# Patient Record
Sex: Male | Born: 1935 | Race: Black or African American | Hispanic: No | Marital: Married
Health system: Southern US, Community
[De-identification: ages and names within clinical notes are randomized; demographics above are authoritative.]

---

## 2007-02-15 ENCOUNTER — Ambulatory Visit: Payer: Self-pay | Admitting: Specialist

## 2011-09-20 ENCOUNTER — Inpatient Hospital Stay: Payer: Self-pay | Admitting: Family Medicine

## 2012-07-06 LAB — BASIC METABOLIC PANEL
BUN: 47 mg/dL — ABNORMAL HIGH (ref 7–18)
Calcium, Total: 8.8 mg/dL (ref 8.5–10.1)
Chloride: 105 mmol/L (ref 98–107)
Co2: 26 mmol/L (ref 21–32)
Creatinine: 2.07 mg/dL — ABNORMAL HIGH (ref 0.60–1.30)
EGFR (Non-African Amer.): 28 — ABNORMAL LOW
Potassium: 5.5 mmol/L — ABNORMAL HIGH (ref 3.5–5.1)
Sodium: 140 mmol/L (ref 136–145)

## 2012-07-06 LAB — CBC
HCT: 37.2 % — ABNORMAL LOW (ref 40.0–52.0)
HGB: 12.3 g/dL — ABNORMAL LOW (ref 13.0–18.0)
MCV: 82 fL (ref 80–100)
RBC: 4.52 10*6/uL (ref 4.40–5.90)
RDW: 15.5 % — ABNORMAL HIGH (ref 11.5–14.5)
WBC: 9 10*3/uL (ref 3.8–10.6)

## 2012-07-06 LAB — HEPATIC FUNCTION PANEL A (ARMC)
Albumin: 2.9 g/dL — ABNORMAL LOW (ref 3.4–5.0)
Bilirubin, Direct: 3 mg/dL — ABNORMAL HIGH (ref 0.00–0.20)
SGOT(AST): 24 U/L (ref 15–37)
SGPT (ALT): 15 U/L (ref 12–78)
Total Protein: 7.3 g/dL (ref 6.4–8.2)

## 2012-07-06 LAB — PRO B NATRIURETIC PEPTIDE: B-Type Natriuretic Peptide: 15188 pg/mL — ABNORMAL HIGH (ref 0–450)

## 2012-07-06 LAB — PROTIME-INR
INR: 1.4
Prothrombin Time: 17.1 secs — ABNORMAL HIGH (ref 11.5–14.7)

## 2012-07-06 LAB — CK TOTAL AND CKMB (NOT AT ARMC): CK-MB: 1.3 ng/mL (ref 0.5–3.6)

## 2012-07-06 LAB — TROPONIN I: Troponin-I: 0.07 ng/mL — ABNORMAL HIGH

## 2012-07-06 LAB — APTT: Activated PTT: 35.4 secs (ref 23.6–35.9)

## 2012-07-07 ENCOUNTER — Inpatient Hospital Stay: Payer: Self-pay | Admitting: Family Medicine

## 2012-07-07 LAB — BASIC METABOLIC PANEL
BUN: 47 mg/dL — ABNORMAL HIGH (ref 7–18)
Calcium, Total: 8.7 mg/dL (ref 8.5–10.1)
Co2: 25 mmol/L (ref 21–32)
Creatinine: 1.87 mg/dL — ABNORMAL HIGH (ref 0.60–1.30)
EGFR (African American): 37 — ABNORMAL LOW
EGFR (Non-African Amer.): 32 — ABNORMAL LOW
Glucose: 113 mg/dL — ABNORMAL HIGH (ref 65–99)
Potassium: 5.1 mmol/L (ref 3.5–5.1)
Sodium: 143 mmol/L (ref 136–145)

## 2012-07-07 LAB — TROPONIN I: Troponin-I: 0.07 ng/mL — ABNORMAL HIGH

## 2012-07-08 ENCOUNTER — Ambulatory Visit: Payer: Self-pay | Admitting: Internal Medicine

## 2012-07-08 LAB — CBC WITH DIFFERENTIAL/PLATELET
Basophil #: 0 10*3/uL (ref 0.0–0.1)
Eosinophil #: 0 10*3/uL (ref 0.0–0.7)
Eosinophil %: 0.1 %
HCT: 32.2 % — ABNORMAL LOW (ref 40.0–52.0)
Lymphocyte #: 0.5 10*3/uL — ABNORMAL LOW (ref 1.0–3.6)
MCHC: 33.2 g/dL (ref 32.0–36.0)
MCV: 81 fL (ref 80–100)
Monocyte #: 0.2 x10 3/mm (ref 0.2–1.0)
Neutrophil #: 4 10*3/uL (ref 1.4–6.5)
Platelet: 110 10*3/uL — ABNORMAL LOW (ref 150–440)
RBC: 3.96 10*6/uL — ABNORMAL LOW (ref 4.40–5.90)
RDW: 15.8 % — ABNORMAL HIGH (ref 11.5–14.5)
WBC: 4.7 10*3/uL (ref 3.8–10.6)

## 2012-07-08 LAB — COMPREHENSIVE METABOLIC PANEL
Anion Gap: 9 (ref 7–16)
BUN: 46 mg/dL — ABNORMAL HIGH (ref 7–18)
Bilirubin,Total: 3.7 mg/dL — ABNORMAL HIGH (ref 0.2–1.0)
Calcium, Total: 8.2 mg/dL — ABNORMAL LOW (ref 8.5–10.1)
Co2: 28 mmol/L (ref 21–32)
EGFR (African American): 39 — ABNORMAL LOW
Osmolality: 298 (ref 275–301)
Potassium: 4.1 mmol/L (ref 3.5–5.1)
SGPT (ALT): 12 U/L (ref 12–78)
Sodium: 143 mmol/L (ref 136–145)
Total Protein: 6.1 g/dL — ABNORMAL LOW (ref 6.4–8.2)

## 2012-07-09 LAB — BASIC METABOLIC PANEL
Anion Gap: 10 (ref 7–16)
BUN: 43 mg/dL — ABNORMAL HIGH (ref 7–18)
Calcium, Total: 8.2 mg/dL — ABNORMAL LOW (ref 8.5–10.1)
Chloride: 101 mmol/L (ref 98–107)
Co2: 29 mmol/L (ref 21–32)
Creatinine: 1.55 mg/dL — ABNORMAL HIGH (ref 0.60–1.30)
EGFR (African American): 46 — ABNORMAL LOW
EGFR (Non-African Amer.): 40 — ABNORMAL LOW
Osmolality: 289 (ref 275–301)
Sodium: 140 mmol/L (ref 136–145)

## 2012-07-10 LAB — BASIC METABOLIC PANEL
Chloride: 102 mmol/L (ref 98–107)
EGFR (Non-African Amer.): 37 — ABNORMAL LOW
Glucose: 83 mg/dL (ref 65–99)
Potassium: 4.1 mmol/L (ref 3.5–5.1)

## 2012-07-11 LAB — BASIC METABOLIC PANEL
Anion Gap: 10 (ref 7–16)
Calcium, Total: 8.1 mg/dL — ABNORMAL LOW (ref 8.5–10.1)
Chloride: 103 mmol/L (ref 98–107)
Co2: 30 mmol/L (ref 21–32)
Creatinine: 1.73 mg/dL — ABNORMAL HIGH (ref 0.60–1.30)
EGFR (African American): 40 — ABNORMAL LOW
Glucose: 95 mg/dL (ref 65–99)
Osmolality: 296 (ref 275–301)

## 2012-07-11 LAB — CBC WITH DIFFERENTIAL/PLATELET
Basophil %: 0.5 %
Eosinophil %: 1.3 %
HCT: 34.3 % — ABNORMAL LOW (ref 40.0–52.0)
HGB: 11.3 g/dL — ABNORMAL LOW (ref 13.0–18.0)
Lymphocyte #: 0.7 10*3/uL — ABNORMAL LOW (ref 1.0–3.6)
Lymphocyte %: 20.1 %
MCV: 81 fL (ref 80–100)
Monocyte #: 0.4 x10 3/mm (ref 0.2–1.0)
Monocyte %: 11.3 %
Neutrophil #: 2.3 10*3/uL (ref 1.4–6.5)
Platelet: 115 10*3/uL — ABNORMAL LOW (ref 150–440)
WBC: 3.4 10*3/uL — ABNORMAL LOW (ref 3.8–10.6)

## 2012-07-12 LAB — BASIC METABOLIC PANEL
Anion Gap: 12 (ref 7–16)
BUN: 43 mg/dL — ABNORMAL HIGH (ref 7–18)
Calcium, Total: 8.3 mg/dL — ABNORMAL LOW (ref 8.5–10.1)
Chloride: 101 mmol/L (ref 98–107)
Co2: 28 mmol/L (ref 21–32)
EGFR (African American): 46 — ABNORMAL LOW
Osmolality: 292 (ref 275–301)
Potassium: 3.6 mmol/L (ref 3.5–5.1)

## 2012-08-20 ENCOUNTER — Inpatient Hospital Stay: Payer: Self-pay | Admitting: Family Medicine

## 2012-08-20 LAB — COMPREHENSIVE METABOLIC PANEL
Albumin: 2.8 g/dL — ABNORMAL LOW (ref 3.4–5.0)
Alkaline Phosphatase: 94 U/L (ref 50–136)
BUN: 60 mg/dL — ABNORMAL HIGH (ref 7–18)
Bilirubin,Total: 4.3 mg/dL — ABNORMAL HIGH (ref 0.2–1.0)
Calcium, Total: 8.5 mg/dL (ref 8.5–10.1)
Co2: 27 mmol/L (ref 21–32)
Creatinine: 2.31 mg/dL — ABNORMAL HIGH (ref 0.60–1.30)
Glucose: 75 mg/dL (ref 65–99)
Osmolality: 297 (ref 275–301)
SGPT (ALT): 13 U/L (ref 12–78)
Sodium: 141 mmol/L (ref 136–145)
Total Protein: 7.3 g/dL (ref 6.4–8.2)

## 2012-08-20 LAB — URINALYSIS, COMPLETE
Glucose,UR: NEGATIVE mg/dL (ref 0–75)
Hyaline Cast: 4
Ph: 6 (ref 4.5–8.0)
Protein: 100
RBC,UR: 6 /HPF (ref 0–5)
Squamous Epithelial: <1
WBC UR: 46 /HPF (ref 0–5)

## 2012-08-20 LAB — CBC
HGB: 11.4 g/dL — ABNORMAL LOW (ref 13.0–18.0)
MCHC: 32.3 g/dL (ref 32.0–36.0)
RBC: 4.39 10*6/uL — ABNORMAL LOW (ref 4.40–5.90)
RDW: 16.4 % — ABNORMAL HIGH (ref 11.5–14.5)

## 2012-08-20 LAB — TROPONIN I: Troponin-I: 0.08 ng/mL — ABNORMAL HIGH

## 2012-08-20 LAB — CK TOTAL AND CKMB (NOT AT ARMC)
CK, Total: 211 U/L (ref 35–232)
CK-MB: 2.2 ng/mL (ref 0.5–3.6)

## 2012-08-20 LAB — PROTIME-INR: Prothrombin Time: 19.6 secs — ABNORMAL HIGH (ref 11.5–14.7)

## 2012-08-21 LAB — CBC WITH DIFFERENTIAL/PLATELET
Basophil %: 0.3 %
Eosinophil %: 0 %
HGB: 11.1 g/dL — ABNORMAL LOW (ref 13.0–18.0)
Lymphocyte #: 0.4 10*3/uL — ABNORMAL LOW (ref 1.0–3.6)
MCH: 26.3 pg (ref 26.0–34.0)
Monocyte #: 0.4 x10 3/mm (ref 0.2–1.0)
Monocyte %: 5.1 %
Neutrophil #: 7.4 10*3/uL — ABNORMAL HIGH (ref 1.4–6.5)
Platelet: 106 10*3/uL — ABNORMAL LOW (ref 150–440)
RBC: 4.21 10*6/uL — ABNORMAL LOW (ref 4.40–5.90)
WBC: 8.2 10*3/uL (ref 3.8–10.6)

## 2012-08-21 LAB — COMPREHENSIVE METABOLIC PANEL
Anion Gap: 12 (ref 7–16)
Bilirubin,Total: 3.6 mg/dL — ABNORMAL HIGH (ref 0.2–1.0)
Calcium, Total: 8.3 mg/dL — ABNORMAL LOW (ref 8.5–10.1)
Chloride: 105 mmol/L (ref 98–107)
Creatinine: 2.15 mg/dL — ABNORMAL HIGH (ref 0.60–1.30)
EGFR (African American): 31 — ABNORMAL LOW
Osmolality: 307 (ref 275–301)
Potassium: 4.4 mmol/L (ref 3.5–5.1)
SGOT(AST): 40 U/L — ABNORMAL HIGH (ref 15–37)
SGPT (ALT): 13 U/L (ref 12–78)
Sodium: 145 mmol/L (ref 136–145)
Total Protein: 6.7 g/dL (ref 6.4–8.2)

## 2012-08-21 LAB — TROPONIN I: Troponin-I: 0.1 ng/mL — ABNORMAL HIGH

## 2012-08-22 LAB — BASIC METABOLIC PANEL
Anion Gap: 11 (ref 7–16)
Calcium, Total: 8.5 mg/dL (ref 8.5–10.1)
Chloride: 104 mmol/L (ref 98–107)
Co2: 27 mmol/L (ref 21–32)
EGFR (African American): 34 — ABNORMAL LOW
EGFR (Non-African Amer.): 30 — ABNORMAL LOW
Glucose: 98 mg/dL (ref 65–99)
Osmolality: 302 (ref 275–301)
Potassium: 4.2 mmol/L (ref 3.5–5.1)
Sodium: 142 mmol/L (ref 136–145)

## 2012-08-22 LAB — URINE CULTURE

## 2012-08-23 LAB — BASIC METABOLIC PANEL
Anion Gap: 11 (ref 7–16)
Calcium, Total: 8.4 mg/dL — ABNORMAL LOW (ref 8.5–10.1)
Chloride: 105 mmol/L (ref 98–107)
Co2: 24 mmol/L (ref 21–32)
Creatinine: 1.9 mg/dL — ABNORMAL HIGH (ref 0.60–1.30)
EGFR (African American): 36 — ABNORMAL LOW
Osmolality: 300 (ref 275–301)
Potassium: 4.5 mmol/L (ref 3.5–5.1)
Sodium: 140 mmol/L (ref 136–145)

## 2012-08-24 LAB — BASIC METABOLIC PANEL
Anion Gap: 9 (ref 7–16)
BUN: 71 mg/dL — ABNORMAL HIGH (ref 7–18)
Calcium, Total: 8.5 mg/dL (ref 8.5–10.1)
Chloride: 105 mmol/L (ref 98–107)
Co2: 25 mmol/L (ref 21–32)
Creatinine: 2.01 mg/dL — ABNORMAL HIGH (ref 0.60–1.30)
EGFR (African American): 34 — ABNORMAL LOW
Osmolality: 299 (ref 275–301)
Potassium: 4.3 mmol/L (ref 3.5–5.1)

## 2012-11-13 DEATH — deceased

## 2012-12-06 IMAGING — US US CAROTID DUPLEX BILAT
1 series · 13 of 24 positions shown · non-contrast
Comparison: None

REASON FOR EXAM: SYNCOPE
COMMENTS:

PROCEDURE:     US  - US CAROTID DOPPLER BILATERAL  - August 20, 2012  [DATE]
RESULT:     Indication: Syncope
TECHNIQUE: Gray-scale, color Doppler, and spectral Doppler images were
obtained of the extracranial carotid artery systems and vertebral arteries
in the neck.

[Series 1: us carotid duplex bilat · 0.07mm/px · 13 of 60 slices shown]
[im 1/60]
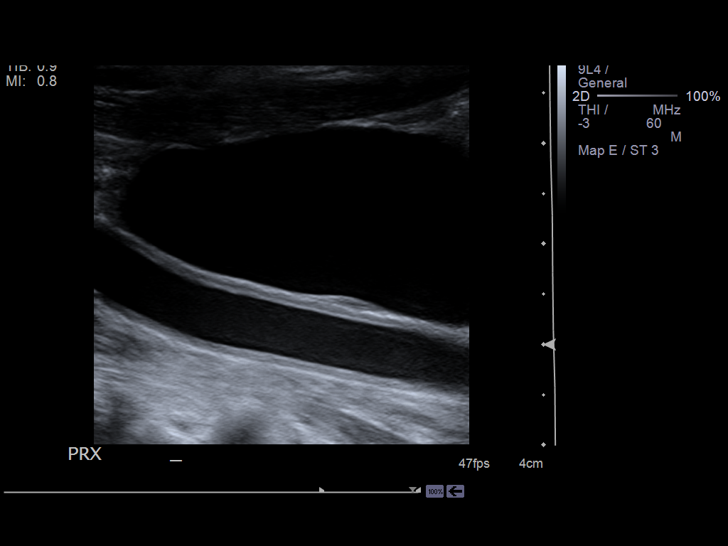
[im 6/60]
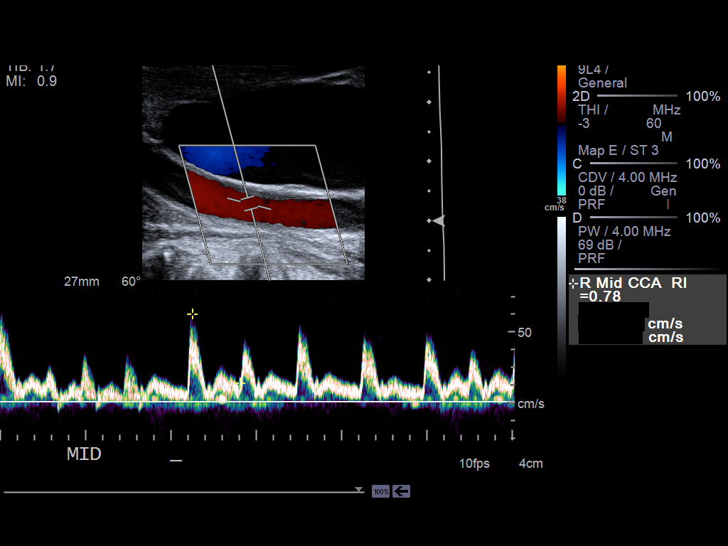
[im 11/60]
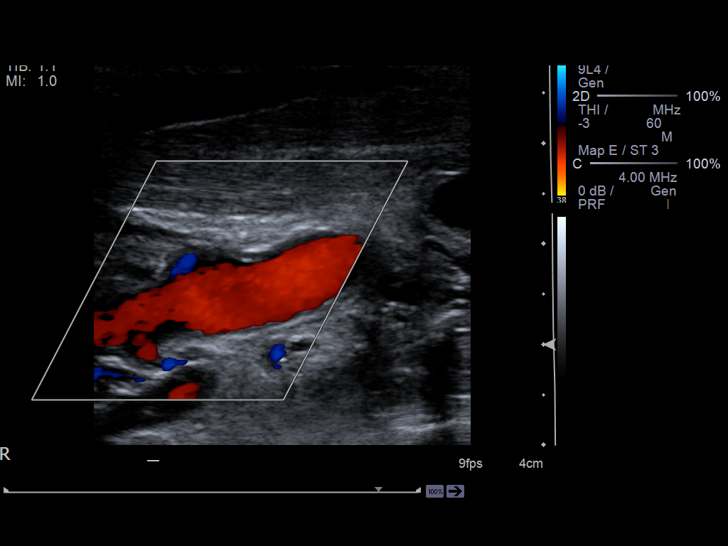
[im 16/60]
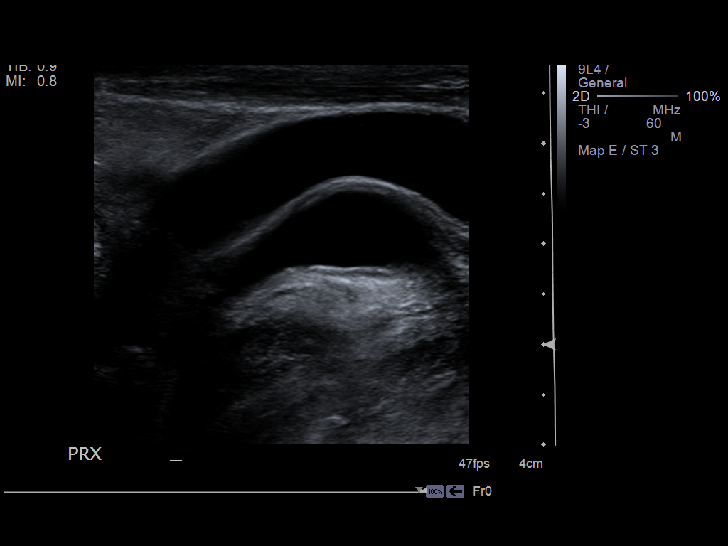
[im 21/60]
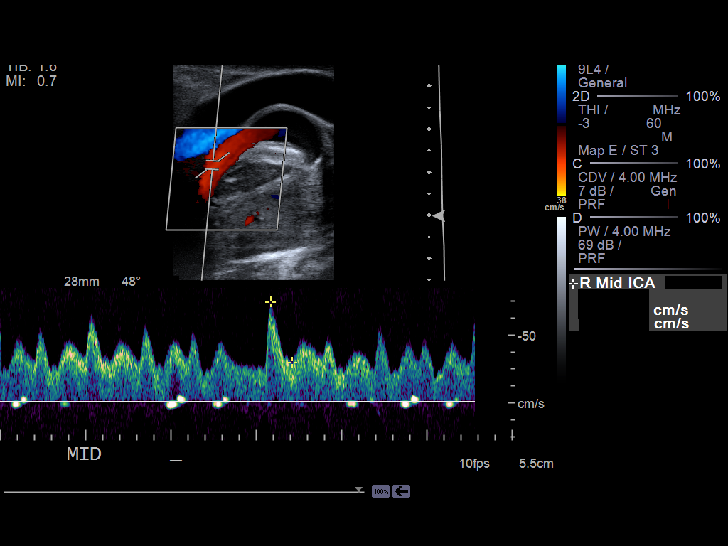
[im 26/60]
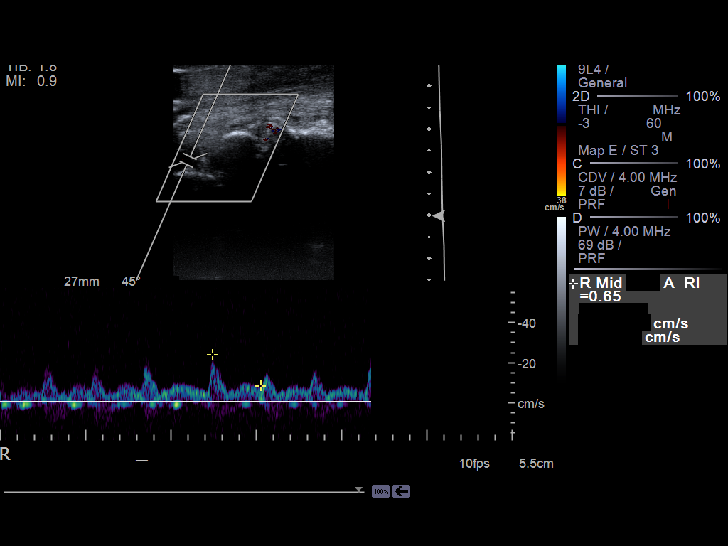
[im 31/60]
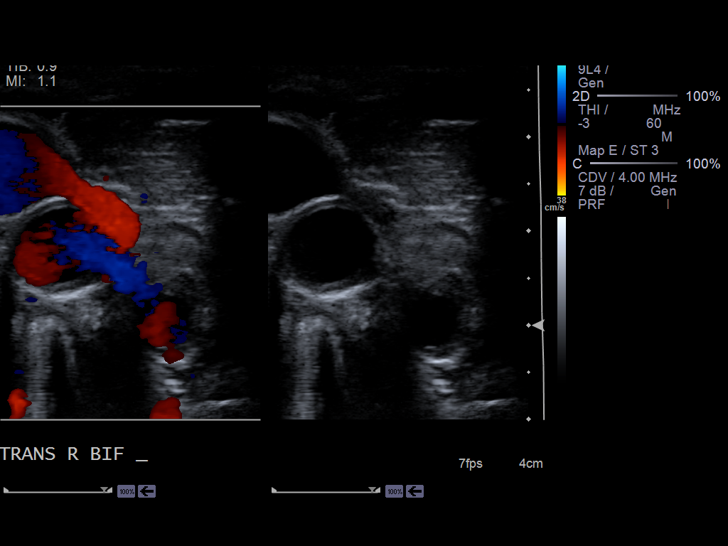
[im 34/60]
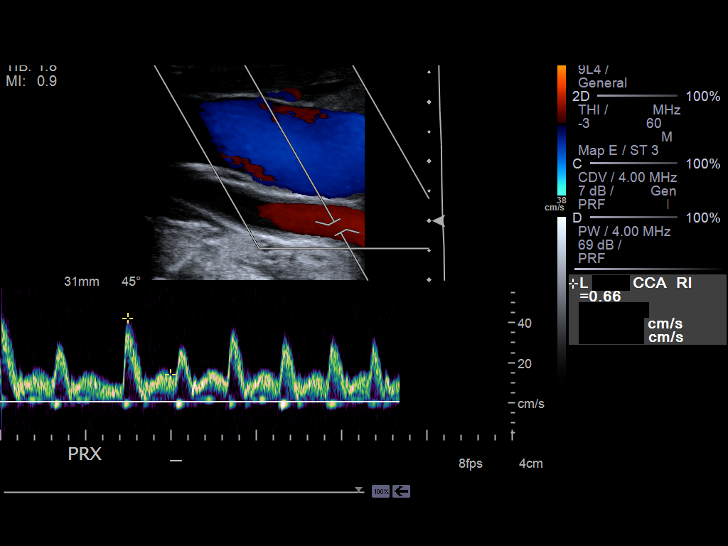
[im 39/60]
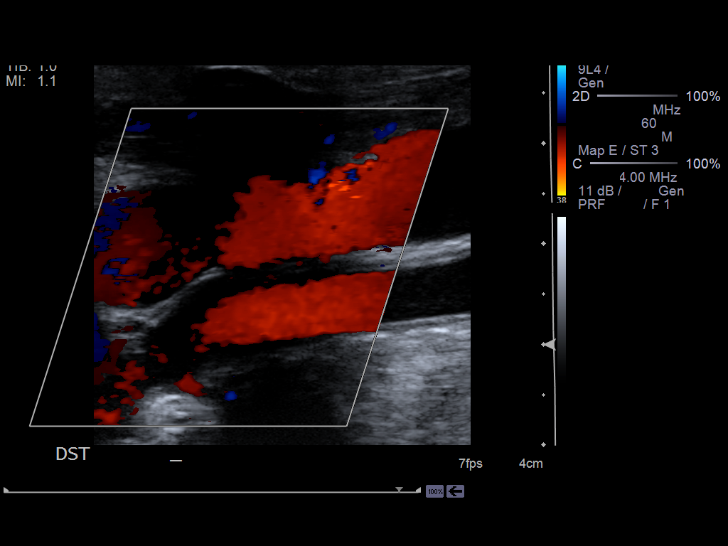
[im 44/60]
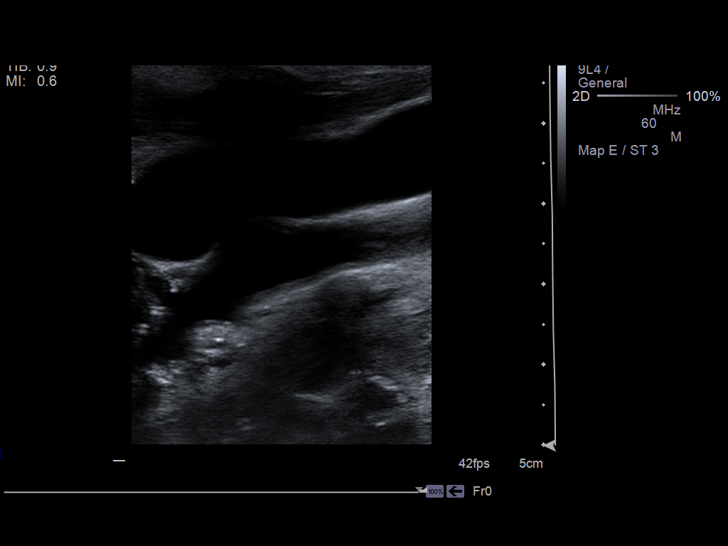
[im 49/60]
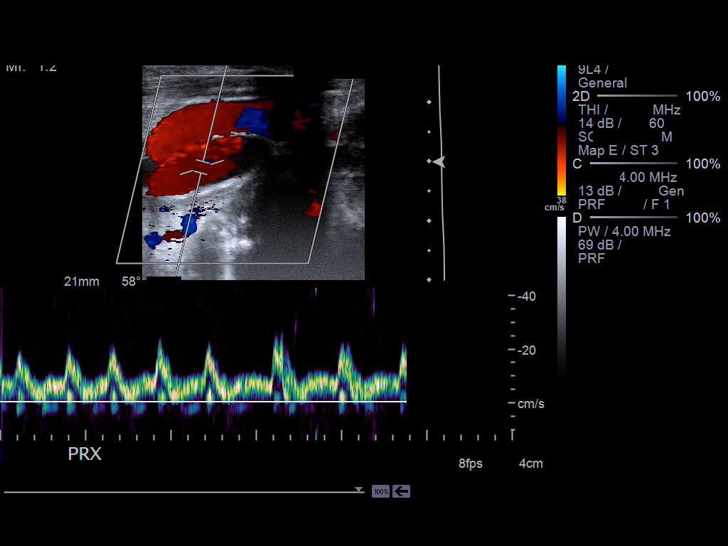
[im 54/60]
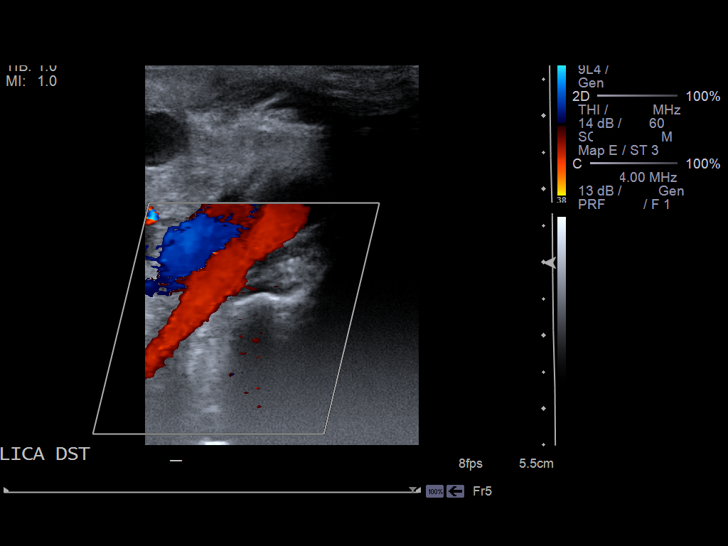
[im 60/60]
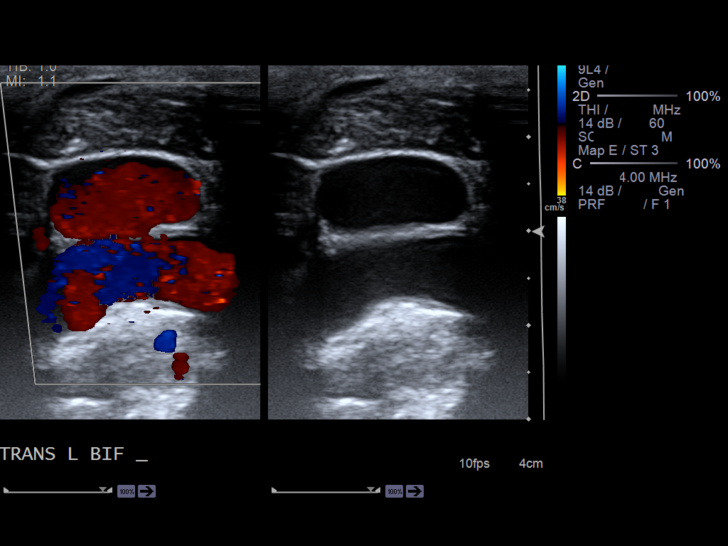

[13 of 24 positions shown; findings below may reference images not displayed]

FINDINGS: On the right, there is no significant atherosclerotic plaque.. Maximum peak
systolic velocity in the right CCA is 63 cm/second. Maximum peak systolic
velocity in the right ICA is 75 cm/second. Maximum peak systolic velocity in
the right ECA is 22 cm/second. The right ICA/CCA ratio is 1.19. This
corresponds to a stenosis of less than 50 %. Antegrade blood flow is
documented in the right vertebral artery.

On the left, there is no significant atherosclerotic plaque. Maximum peak
systolic velocity in the left CCA is 40 cm/second. Maximum peak systolic
velocity in the left ICA is 64 cm/second. Maximum peak systolic velocity in
the left ECA is 29 cm/second. The left ICA/CCA ratio is 1.59. This
corresponds to a stenosis of less than 50 %. Antegrade blood flow is
documented in the left vertebral artery.
IMPRESSION: 1. No hemodynamically significant carotid artery stenosis.

[REDACTED]

## 2012-12-06 IMAGING — CT CT HEAD WITHOUT CONTRAST
2 series · 16 of 30 positions shown, 20 images · non-contrast
Comparison: none

REASON FOR EXAM: change in ms after fall weakness
COMMENTS:

[Series 2: without · axial · non-contrast · 0.44mm/px · z∈[-126,+14]mm · 13 of 34 slices shown, 17 images]
[im 3/34  brain]
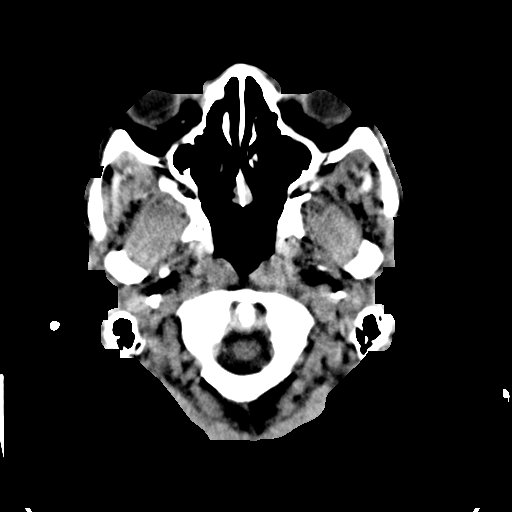
[im 3/34  bone]
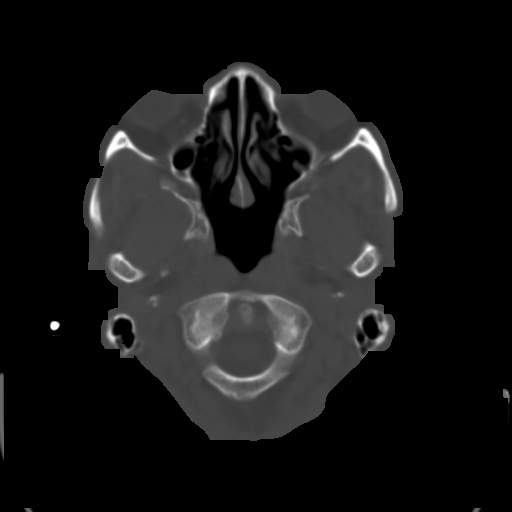
[im 5/34  brain]
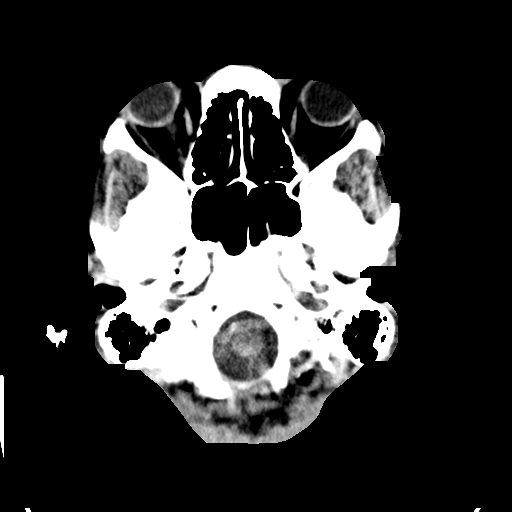
[im 8/34  brain]
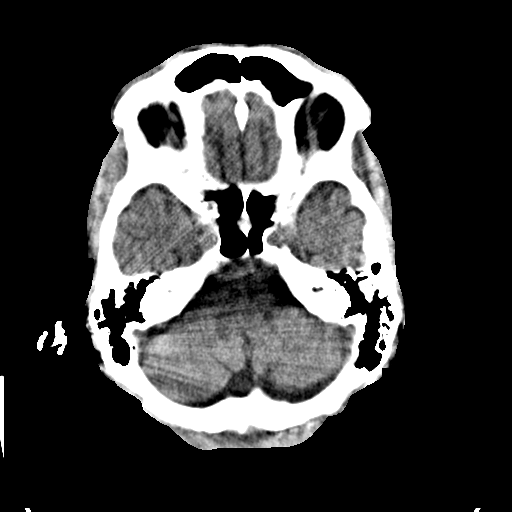
[im 10/34  brain]
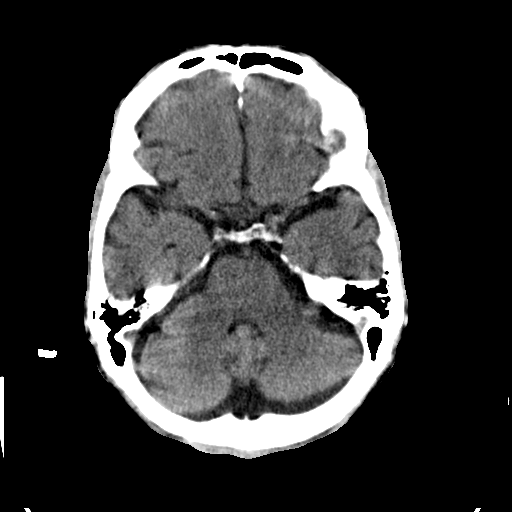
[im 12/34  brain]
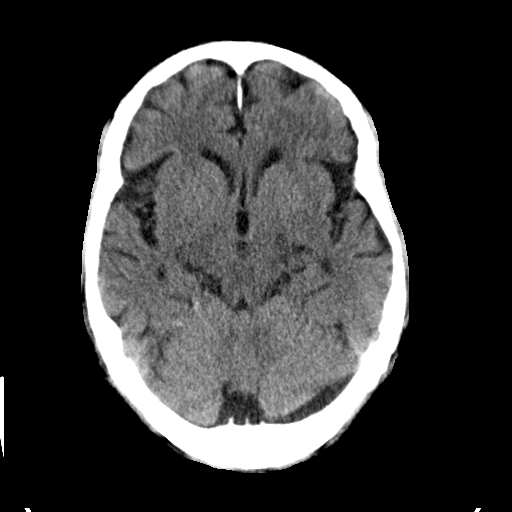
[im 12/34  bone]
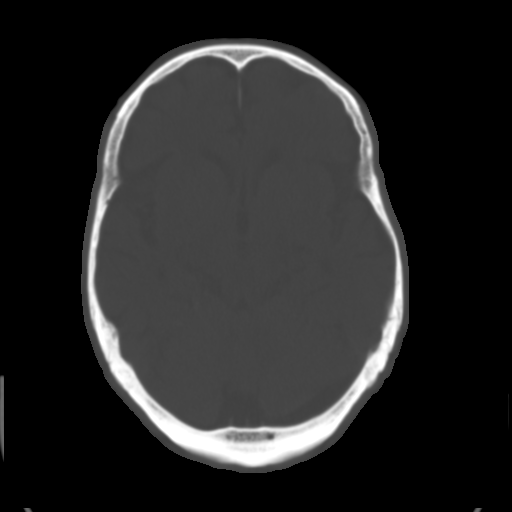
[im 15/34  brain]
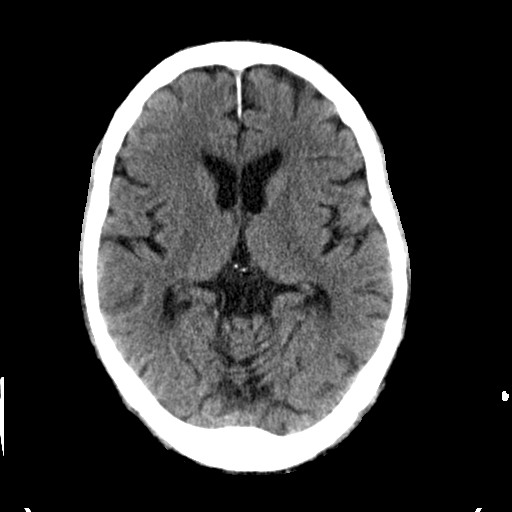
[im 17/34  brain]
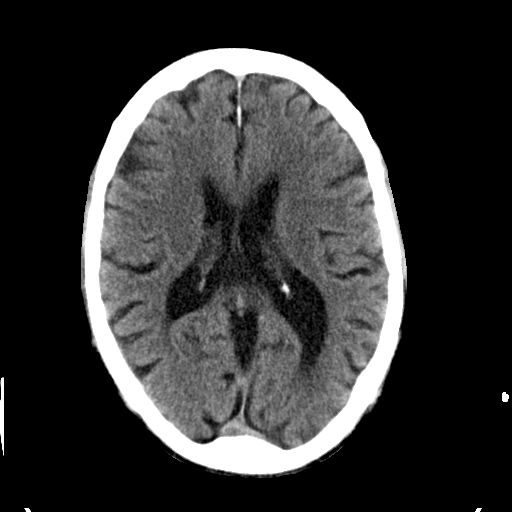
[im 19/34  brain]
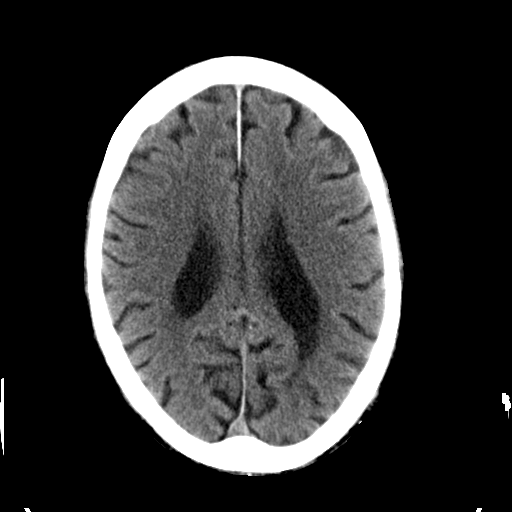
[im 22/34  brain]
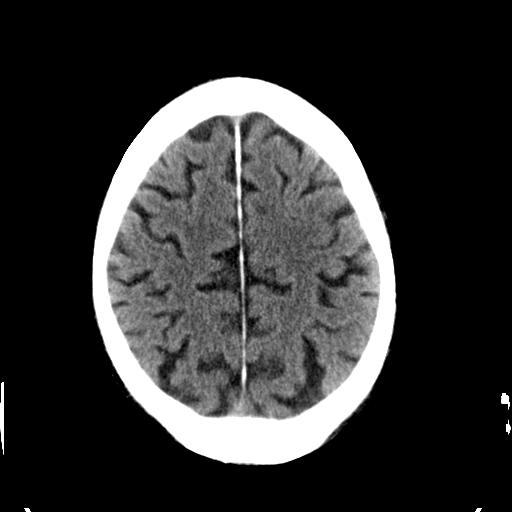
[im 22/34  bone]
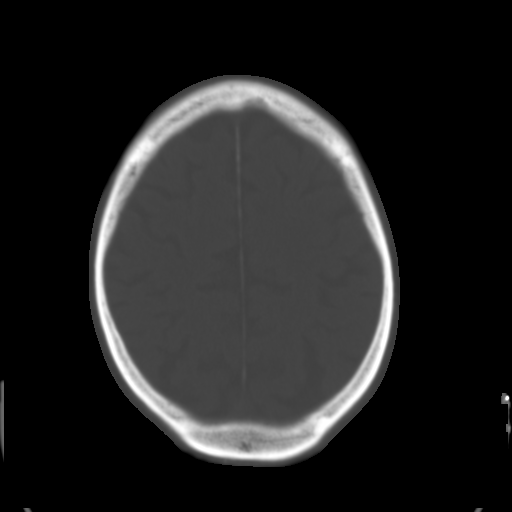
[im 24/34  brain]
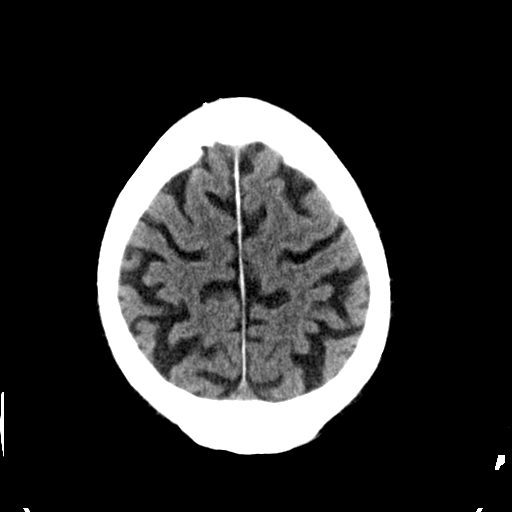
[im 26/34  brain]
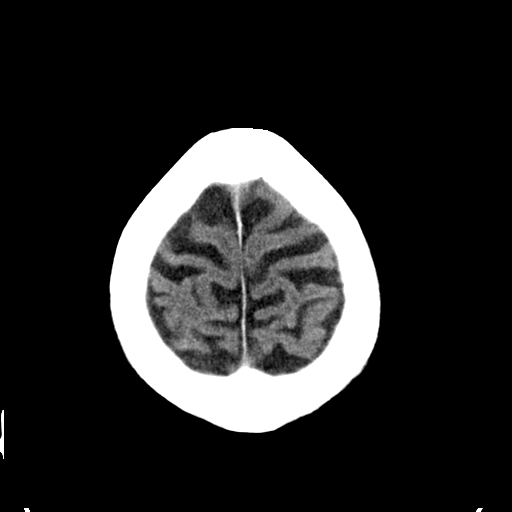
[im 29/34  brain]
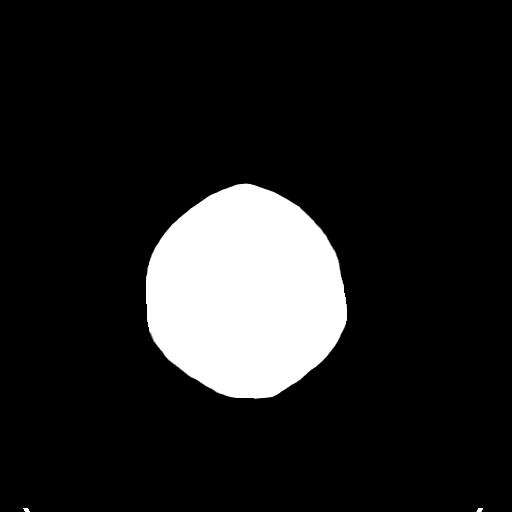
[im 31/34  brain]
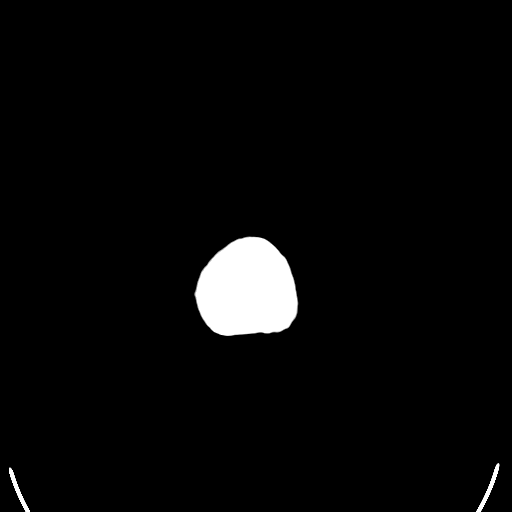
[im 31/34  bone]
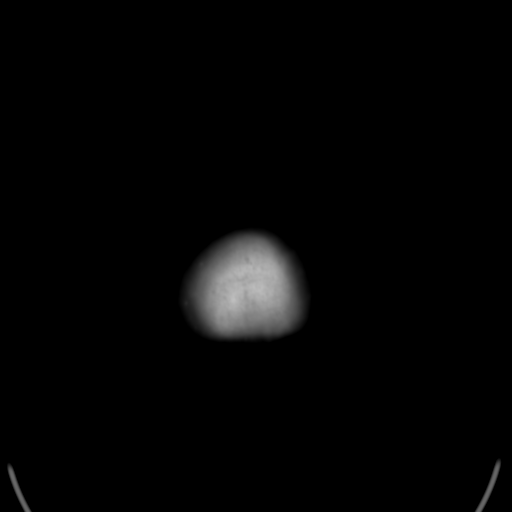

[Series 3: bone · axial · 0.44mm/px · z∈[-126,-80]mm · 3 of 34 slices shown]
[im 3/34  bone]
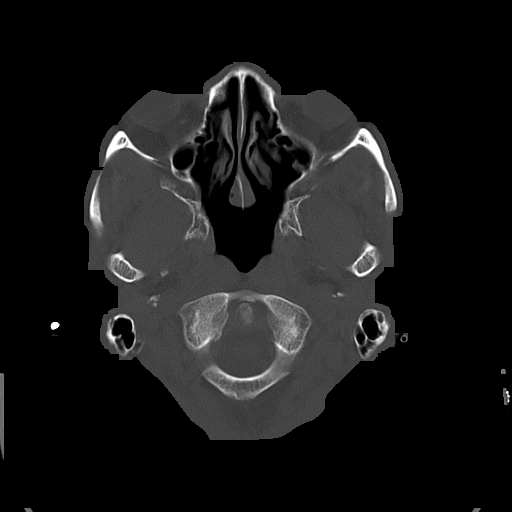
[im 8/34  bone]
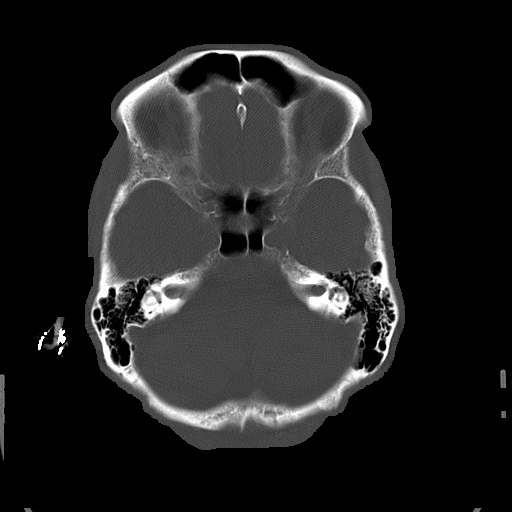
[im 12/34  bone]
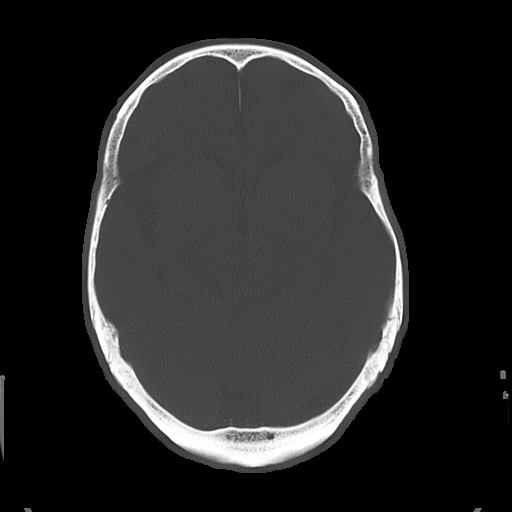

[16 of 30 positions shown; findings below may reference images not displayed]

PROCEDURE:     CT  - CT HEAD WITHOUT CONTRAST  - August 20, 2012 [DATE]

RESULT:     Noncontrast CT of the brain is compared to the previous study 20 September, 2011.

There is atrophy of the cerebral and cerebellar hemispheres. There is no
intracranial hemorrhage, mass or mass effect. There is no midline shift.
Low-attenuation in the periventricular and subcortical white matter is most
likely consistent with microvascular ischemic disease. The sinuses and
mastoid air cells show normal aeration. The calvarium is intact.
IMPRESSION: 1. Atrophy with chronic microvascular ischemic disease. No acute
intracranial abnormality evident. Stable appearance.

[REDACTED]

## 2012-12-06 IMAGING — CR DG CHEST 2V
1 series · 3 of 3 positions shown · non-contrast
Comparison: none

REASON FOR EXAM: sob
COMMENTS:

[Series 1: x chest ap · 0.14mm/px · 3 of 3 slices shown]
[im 1/3]
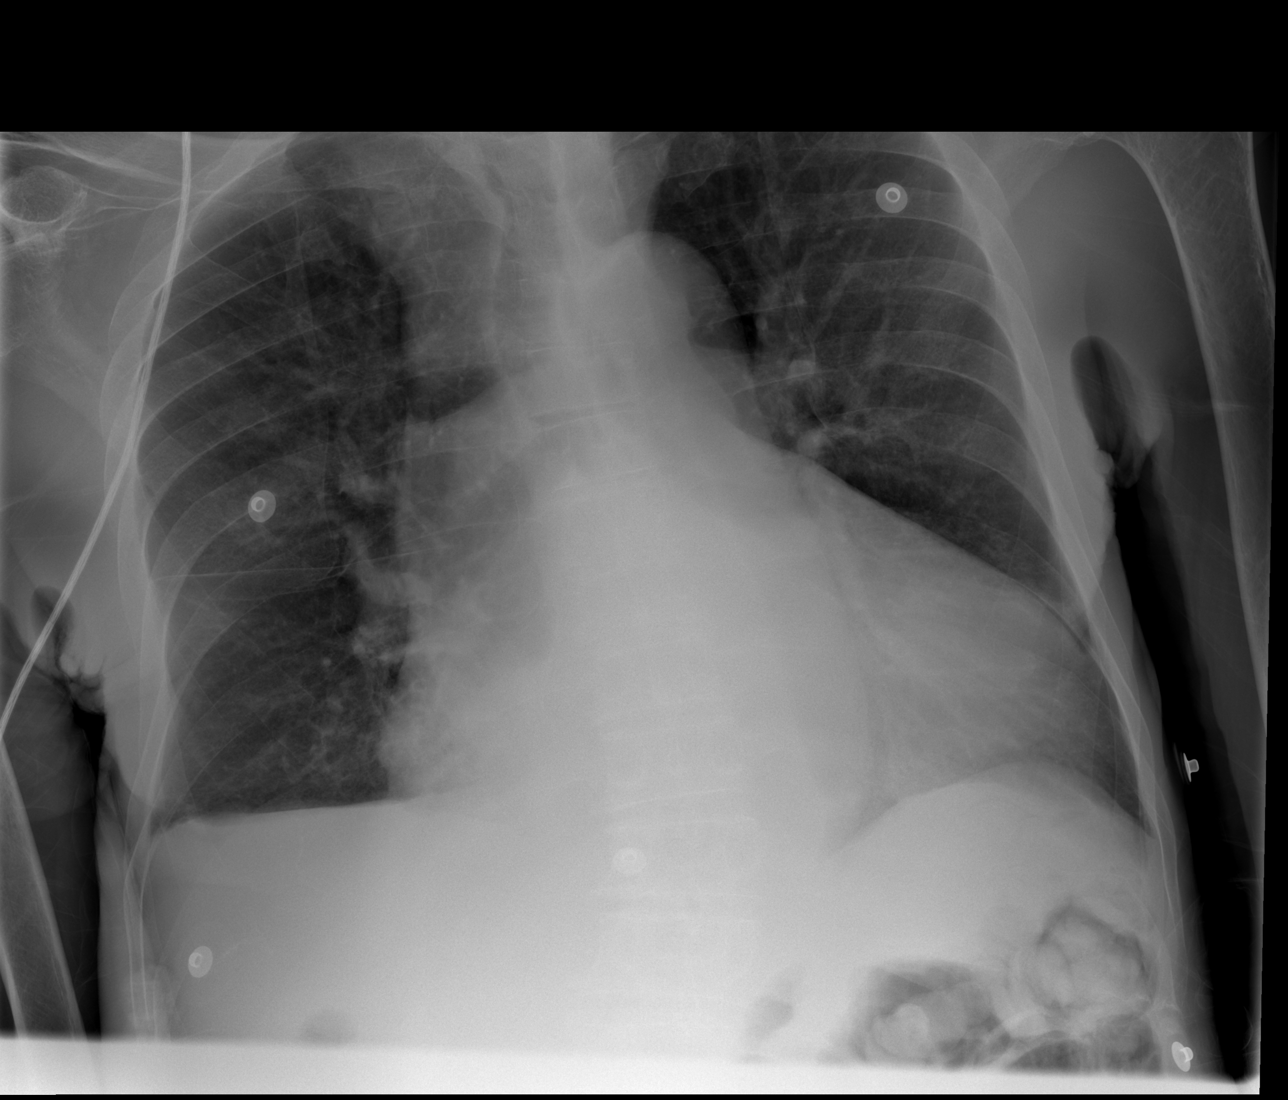
[im 2/3]
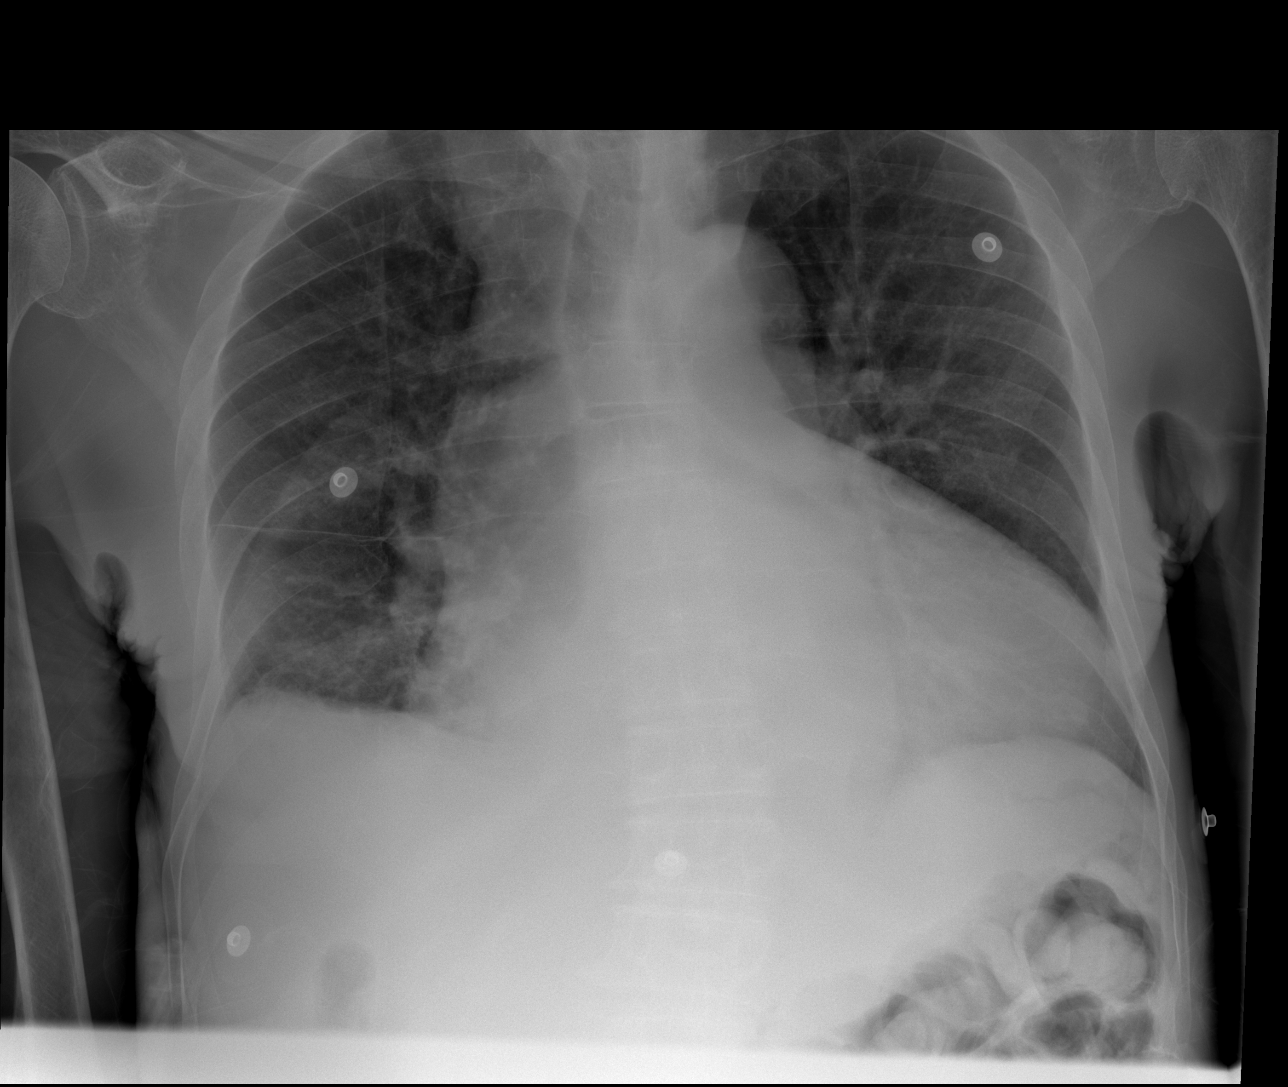
[im 3/3]
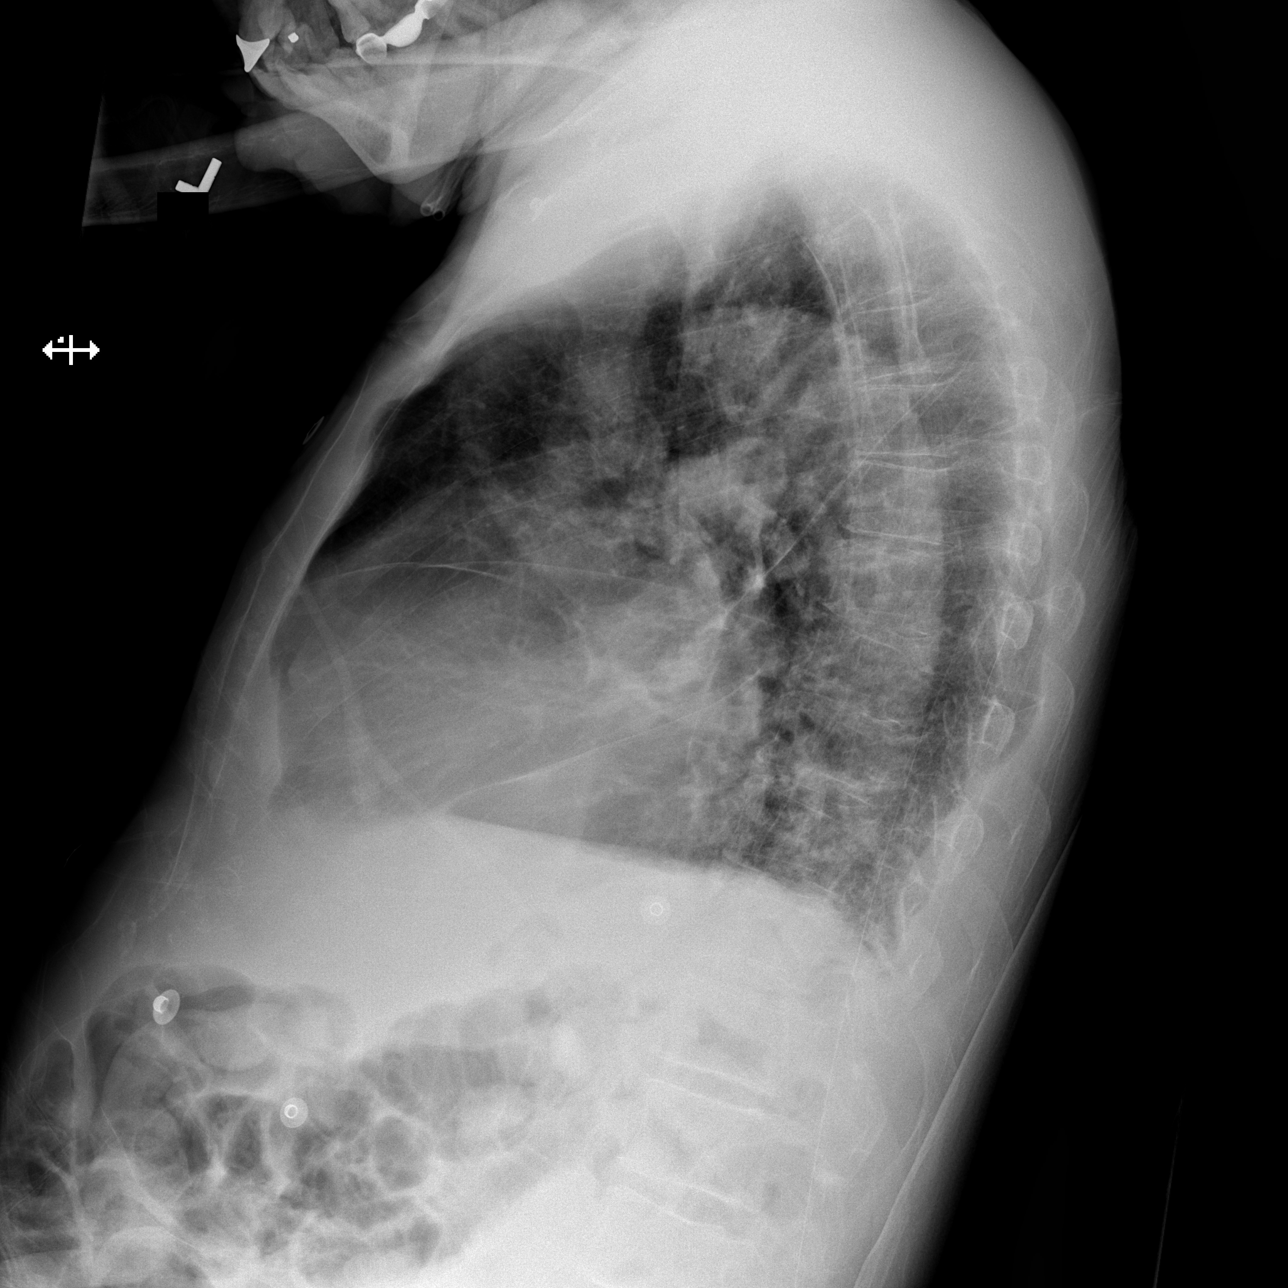

[3 of 3 positions shown; findings below may reference images not displayed]

PROCEDURE:     DXR - DXR CHEST PA (OR AP) AND LATERAL  - August 20, 2012 [DATE]

RESULT:     Comparison is made to the study July 09, 2012.

The cardiac silhouette is enlarged. The pulmonary vascularity is prominent
centrally and there is mild stable cephalization. There is tortuosity of the
descending thoracic aorta. There is no alveolar infiltrate nor is there
evidence of significant pleural effusion. There are coarse lung markings in
both lower lobes posteriorly which may reflect subsegmental atelectasis.
IMPRESSION: The findings suggest low-grade compensated CHF. I cannot
exclude bibasilar subsegmental atelectasis.

[REDACTED]

## 2012-12-07 IMAGING — CR DG CHEST 2V
1 series · 3 of 3 positions shown · non-contrast
Comparison: none

REASON FOR EXAM: syncope
COMMENTS:

PROCEDURE:     DXR - DXR CHEST PA (OR AP) AND LATERAL  - August 21, 2012  [DATE]
RESULT:     Comparison: 08/20/2012

[Series 4: x chest ap · 0.14mm/px · 3 of 3 slices shown]
[im 1/3]
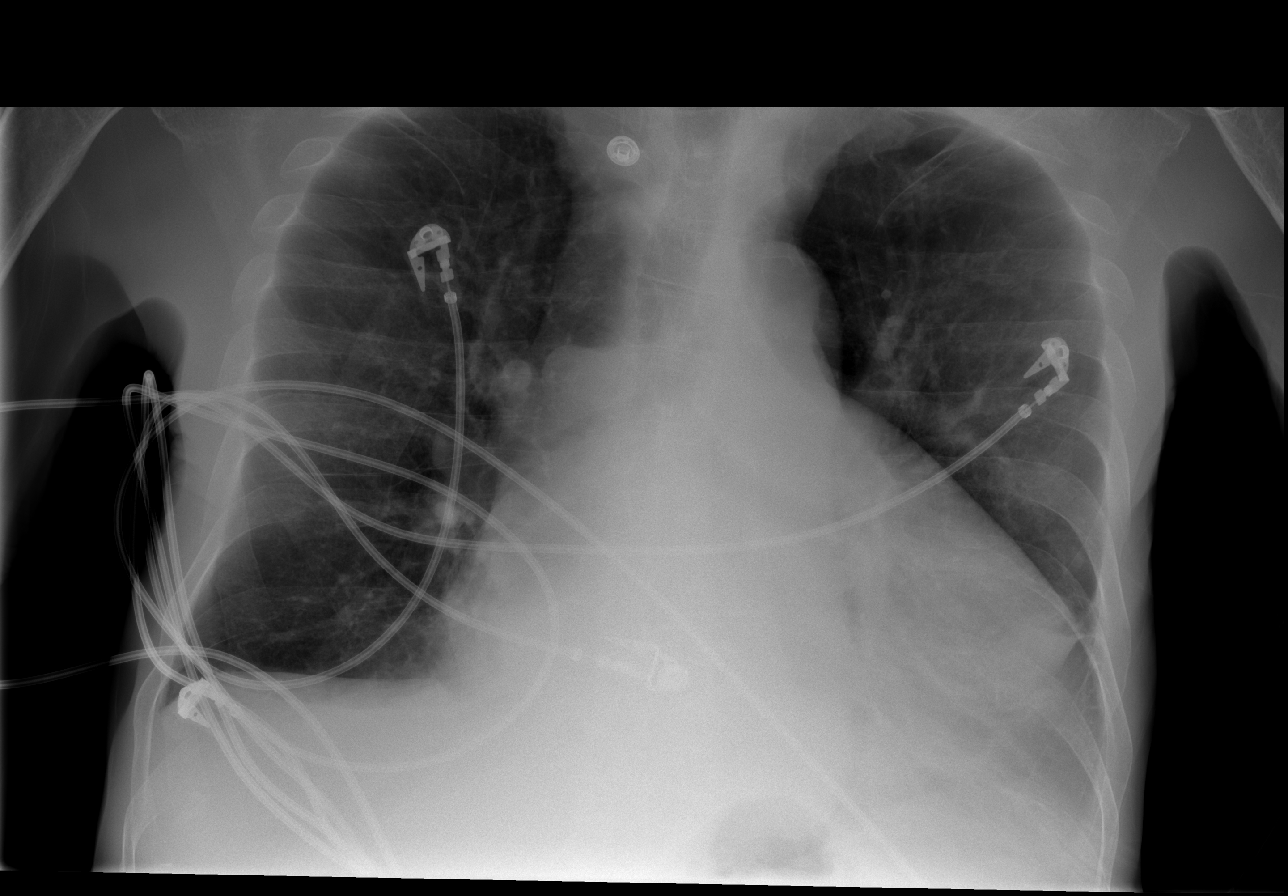
[im 2/3]
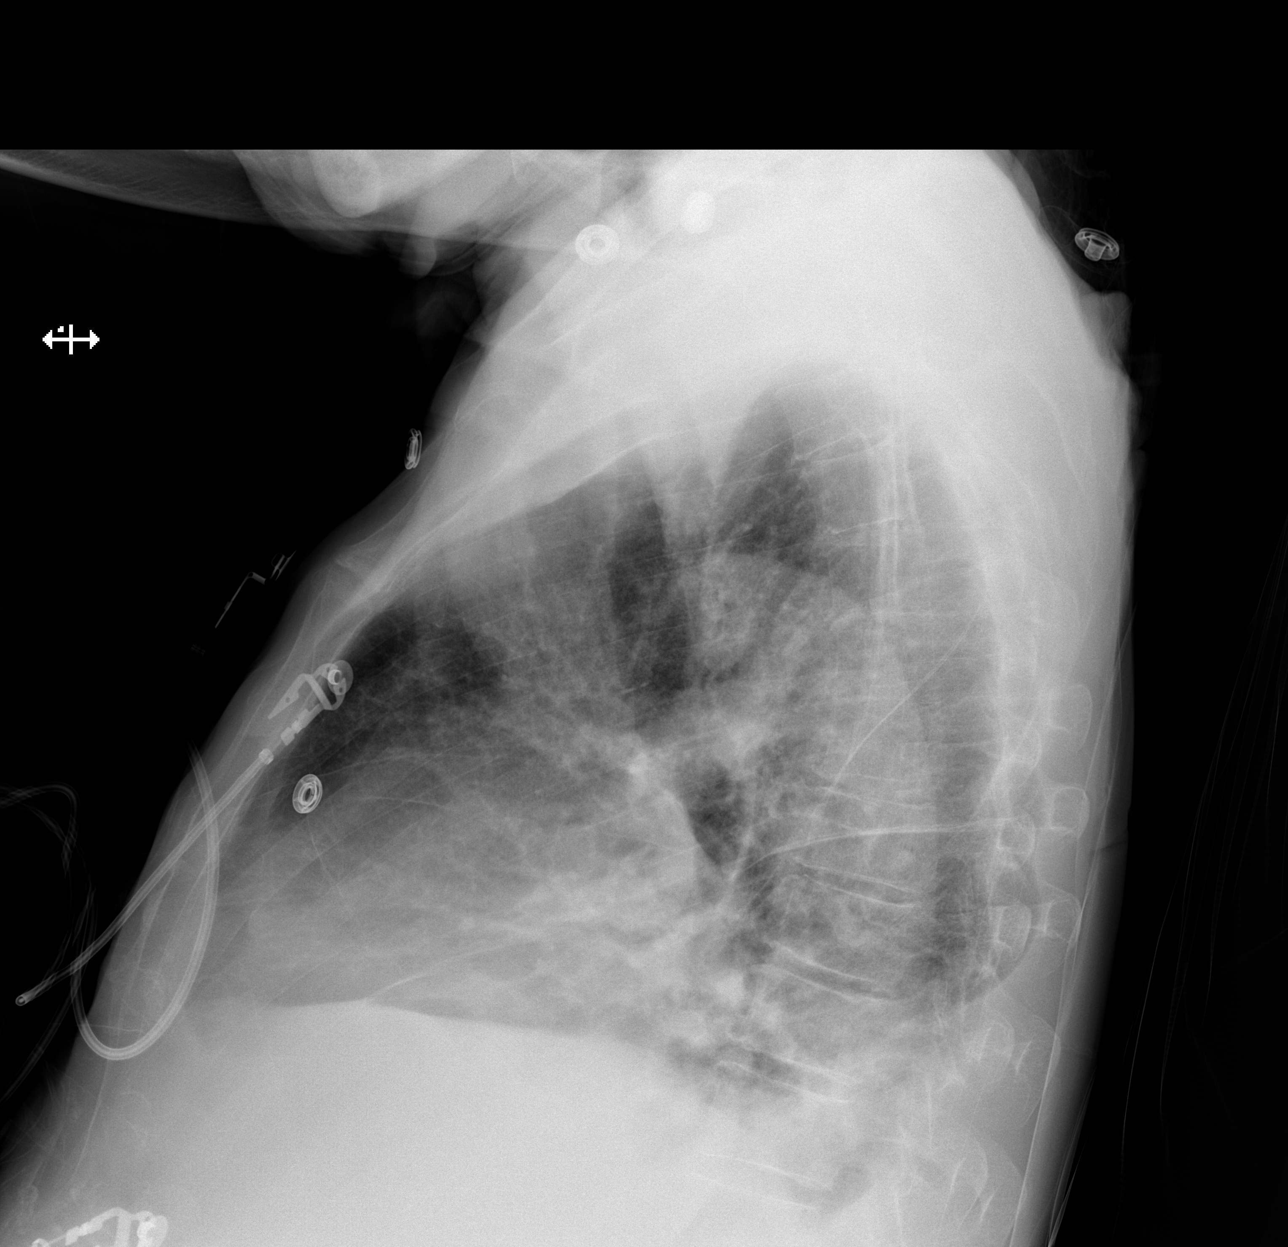
[im 3/3]
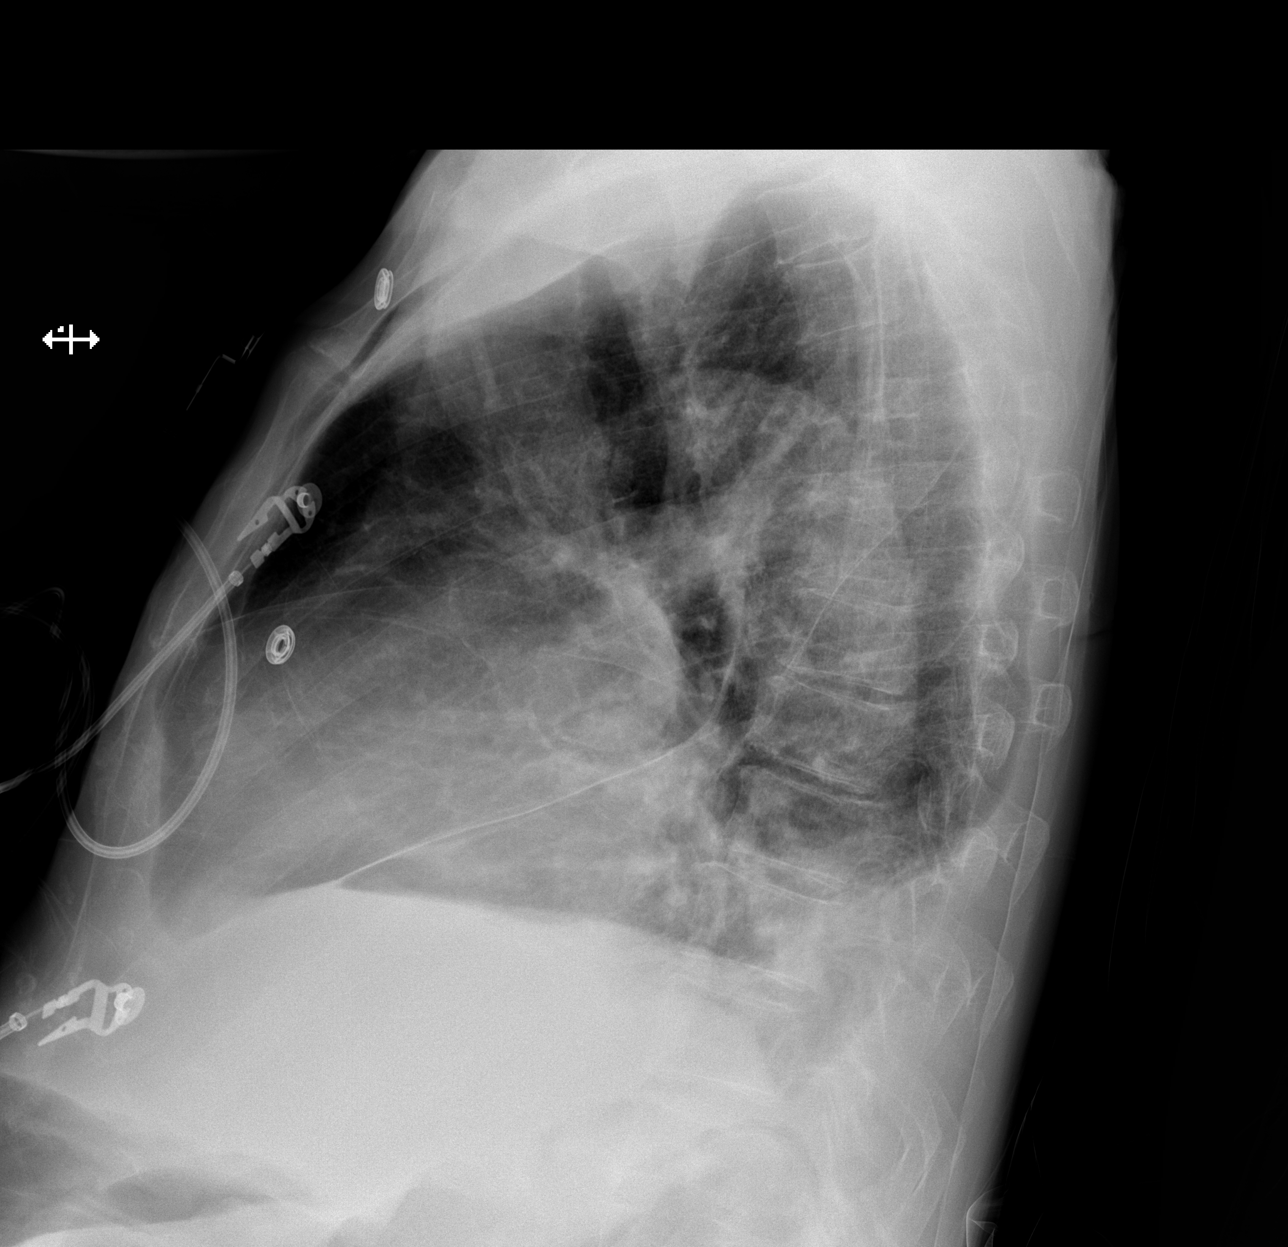

[3 of 3 positions shown; findings below may reference images not displayed]

FINDINGS: PA and lateral chest radiographs are provided. There is mild interstitial
thickening bilaterally which is improved compared with the prior
examination. There is a trace left pleural effusion. The heart size is
enlarged. The osseous structures are unremarkable.
IMPRESSION: Please see above.

[REDACTED]

## 2015-01-30 NOTE — Consult Note (Signed)
Comments   Met with pt, his wife, and ex-daughter-in-law (Virgie). Family updated. They feel that patient has been doing well at home. They have heard hospice recommendations that patient may need increased amounts of daily care but they hope that pt will be able to return to his previous level of functioning. Family and patient want to see how he does after discharge prior to making any decisions on caregivers. They know they can rely on hospice to help facilitate resources.  25 minutes  Electronic Signatures: Borders, Kirt Boys (NP)  (Signed 27-Sep-13 15:28)  Authored: Palliative Care   Last Updated: 27-Sep-13 15:28 by Irean Hong (NP)

## 2015-01-30 NOTE — Discharge Summary (Signed)
PATIENT NAME:  Randy Peck, Ragnar MR#:  [id number] DATE OF BIRTH:  January 01, 1923  DATE OF ADMISSION:  07/07/2012 DATE OF DISCHARGE:  07/12/2012  DISCHARGE DIAGNOSES:  1. Systolic congestive heart failure exacerbation with anasarca.  2. Acute on chronic renal failure, back to baseline with creatinine 1.5.  3. Hypotension, resolving.  4. Atrial fibrillation, on aspirin.  5. Moderate pulmonary hypertension.  6. Questionable chronic obstructive pulmonary disease.   DISCHARGE MEDICATIONS:  1. Aspirin 81 mg p.o. daily basis.  2. Symbicort 160/4.5 two puffs once a day.  3. Spiriva 18 mcg one inhalation daily. 4. Metoprolol 25 mg p.o. twice a day.  5. Furosemide 80 mg p.o. twice a day. 6. Vicodin 5/325 mg p.o. every four hours p.r.n. for severe pain.   CONSULTANTS:  1. Nephrology. 2. Cardiology.   PROCEDURES: The patient underwent an echo that showed an ejection fraction of 40 to 45% with severe mitral and tricuspid regurgitation.   PERTINENT LABS ON DAY OF DISCHARGE: Patient's blood pressure was between 100 to 130 systolic with 02 needs on 2 liters.  Sodium 141, potassium 3.6, BUN 43, and creatinine 1.54.   BRIEF HOSPITAL COURSE:  1. Systolic congestive heart failure exacerbation. The patient initially came in with interstitial edema, anasarca, and difficulty breathing. On chest x-ray, it was consistent with pulmonary edema from underlying heart failure, previously thought to have diastolic heart failure and underwent an echocardiogram per cardiology and did show a reduced ejection fraction of 40 to 45% with severe mitral and tricuspid regurgitation. He was diuresed aggressively and was placed on furosemide drip for which he has responded appropriately. He has continued to diurese and his renal function has improved as well as his electrolytes. We will plan to continue with oral furosemide at 80 mg p.o. twice a day, per nephrology. Will follow-up with him as an outpatient.  2. Acute on chronic  renal failure. He initially came in with creatinine above 2, baseline is around 1.5, and it was thought to be related to his acute congestive heart failure exacerbation. The patient responded well to diuresis and his creatinine has returned to his baseline status. He will need follow-up as an outpatient and will need close follow-up of creatinine and electrolytes as an outpatient. At this time, we are going to hold off on an ACE inhibitor because he did suffer from hypotension during his hospital stay. If his blood pressures remain stable as an outpatient, we will likely add low-dose lisinopril. 3. His other chronic medical issues remained stable. No changes to those regimens.   DISPOSITION: He is in stable condition and is being discharged to home with hospice. He will need 1 liter of O2 at all times. He will followup with Dr. Burnadette PopLinthavong in one week and followup with nephrology and they will contact him.  ____________________________ Marisue IvanKanhka Bettylee Feig, MD kl:slb D: 07/12/2012 08:14:45 ET T: 07/12/2012 10:45:04 ET JOB#: [id number]  cc: Marisue IvanKanhka Darrell Leonhardt, MD, <Dictator> Marisue IvanKANHKA Krishna Heuer MD ELECTRONICALLY SIGNED 07/29/2012 8:23

## 2015-01-30 NOTE — Consult Note (Signed)
Brief Consult Note: Diagnosis: CHF, diastolic. Borderline elevated trop, probable demand/supply not ACS.   Patient was seen by consultant.   Consult note dictated.   Comments: REC  Agree with current therapy, cont furosemide drip, carefully monitor BUN/Cr, review echo.  Electronic Signatures: Marcina MillardParaschos, Toivo Bordon (MD)  (Signed 25-Sep-13 16:21)  Authored: Brief Consult Note   Last Updated: 25-Sep-13 16:21 by Marcina MillardParaschos, Sybella Harnish (MD)

## 2015-01-30 NOTE — H&P (Signed)
PATIENT NAME:  Randy Peck, Randy Peck MR#:  [id number] DATE OF BIRTH:  1923-06-27  DATE OF ADMISSION:  07/07/2012  REFERRING PHYSICIAN: Rockne Menghini, MD PRIMARY CARE PHYSICIAN: Marisue Ivan, MD  CHIEF COMPLAINT: Worsening edema and shortness of breath.   HISTORY OF PRESENT ILLNESS: This is an 81 year old male with significant past medical history of diastolic congestive heart failure with severe bilateral enlargement, atrial fibrillation, hypertension, hyperlipidemia, and chronic kidney disease who presents with complaint of worsening edema as well as shortness of breath. The patient reports he has baseline lower extremity edema, but he reports it has been getting progressively worse over the last few days, and he reports currently it is involving his upper extremity and arms, as well reports of shortness of breath mainly upon lying supine. He has baseline exertional dyspnea which did not get worse. The patient denies any fever, chills, cough, productive sputum, any recent change in weight. The patient had chest x-ray which did show significant cardiomegaly and vascular congestion. As well patient had significantly elevated pro-BNP at 15,188. Initially patient when he presented he had atrial fibrillation with RVR in the low 100s. Patient is known to have history of atrial fibrillation.  He is on aspirin for that. Not on any anticoagulation secondary to questionable compliance. Also patient had borderline elevated troponin at 0.07. The patient denies any chest pain. As well he did not have any significant ST or T wave changes from previous EKG. Patient was hypotensive at one point with systolic blood pressure being 80 so he could not be started on diuresis, actually was bolused 250 mL of normal saline. Patient reports he is on oxygen 2 liters at home. Currently he is saturating 96% on 3 liters nasal cannula.   PAST MEDICAL HISTORY:  1. Atrial fibrillation, on aspirin.  2. Congestive heart failure.   3. Hypertension.  4. Hyperlipidemia.  5. Anemia.  6. Peptic ulcer disease.  7. Chronic kidney disease.    PAST SURGICAL HISTORY:  1. Lysis of adhesions.  2. Inguinal hernia. 3. Cataract.  4. Varicose vein laser.   HOME MEDICATIONS:  1. Ventolin 2 puffs every 4 to 6 hours as needed.  2. Symbicort 160/4.5, two puffs daily.  3. Spiriva 18 mcg daily.  4. Senna 1 to 2 tablets 2 times a day.  5. Roxanol as needed.  6. Oxycodone 5 mg 1 tablet every 4 hours as needed.  7. Mucinex 1200 mg oral b.i.d.   8. Morphine 15 mg every 12 hours.  9. Mirtazapine 15 mg oral daily.  10. Lisinopril 5 mg oral daily.  11. Lasix 20 mg oral daily.  12. Aspirin 81 mg daily.  13. Acetaminophen 325 two tablets every 6 hours as needed.   ALLERGIES: No known drug allergies.   SOCIAL HISTORY: No smoking. No alcohol. No drug use. Works in Systems analyst work Electrical engineer.   FAMILY HISTORY: Significant for CVA in the family in his mother.   REVIEW OF SYSTEMS: The patient denies any fever. Complains of fatigue and weakness. EYES: Denies blurry vision, double vision or pain. ENT: Denies tinnitus, ear pain, hearing loss. RESPIRATORY: Denies any cough, wheezing, hemoptysis. Has, orthopnea and baseline dyspnea on exertion. CARDIOVASCULAR: Denies any chest pain. Has worsening edema in upper and lower extremity and face. Denies any palpitation or syncope. GI: Denies nausea, vomiting, diarrhea, abdominal pain, hematemesis. GU: Denies dysuria, hematuria, renal colic. ENDOCRINE: Denies polyuria, polydipsia, heat or cold intolerance. HEMATOLOGY: Denies anemia, easy bruising, bleeding diathesis. INTEGUMENTARY: Has chronic lower extremity changes secondary  to edema. MUSCULOSKELETAL: Denies any cramps, gout, or redness. NEUROLOGIC: Denies any dysarthria, epilepsy, tremors, vertigo, ataxia. PSYCHIATRIC: Denies any schizophrenia, bipolar disorder, nervousness, insomnia.   PHYSICAL EXAMINATION:  VITAL SIGNS: Temperature 97.9, pulse  79, respiratory rate 20, blood pressure 107/71, saturating 98% on 3L nasal cannula.   GENERAL: Frail, ill-appearing, elderly male, is comfortable in bed in no apparent distress in a sitting position.   HEENT: Head atraumatic, normocephalic. Pupils equal, reactive to light. Pink conjunctivae. Anicteric sclerae. Moist oral mucosa.   NECK: Supple. Had JVD more than 8 cm, no thyromegaly.   CHEST: Patient had bibasilar crackles and scattered rales. Good air entry.   CARDIOVASCULAR: S1, S2 heard. No rubs, murmur, or gallops. Has irregular, irregular rhythm.   ABDOMEN: Soft, nontender, nondistended. Bowel sounds present.   EXTREMITIES: Has +3 bilateral lower extremity pitting edema and has as well  upper extremity edema/anasarca. No clubbing. No cyanosis.   SKIN: Has chronic lower extremity changes.   PSYCHIATRIC: Appropriate affect. Awake, alert x3. Intact judgment and insight. Pleasant.  Has appropriate judgment.    NEUROLOGICAL: Cranial nerves grossly intact. Motor five out of five. No focal deficits.   PERTINENT LABORATORIES: Glucose 115.  Pro-BNP 15,188, BUN 47, creatinine 2.07, sodium 140, potassium 5.5, chloride 105, CO2 of 26. Anion gap 9. White blood cells 9, hemoglobin 12.3, hematocrit 37.2, platelets 144,000. Troponin 0.07. INR 1.4.   EKG showing atrial fibrillation with rate of 93.   ASSESSMENT AND PLAN:  1. Anasarca with orthopnea.  This is most likely related to acute congestive heart failure exacerbation. The patient is known to have history of diastolic congestive heart failure with severe bilateral enlargement. Ideally would start patient on diuresis but unfortunately secondary to his low blood pressure cannot start on any diuresis. Actually patient was hypotensive initially upon presentation where he had to be started on IV fluids, received a total of 250 bolus in ED.  Currently blood pressure is most stable, We will admit the patient to CCU as at one point if her blood pressure  drops again might need pressors. We will try to bolus p.r.n. as long as his respiratory status allows for hypotension and will continue to hold his lisinopril and diuresis. As well [facility], and we will obtain 2D echocardiogram.   2. Elevated troponin. The patient denies any chest pain, has no EKG changes. This is most likely related to his worsening renal function and initial presentation was atrial fibrillation with RVR. We will cycle troponins. The patient is already on aspirin.  3. Hyperkalemia.  We will give Kayexalate, recheck in a.m.  4. Atrial fibrillation, currently is rate controlled. We will have patient on telemonitor, and patient is on aspirin. By reviewing his history patient is not on warfarin secondary to issue of compliance.  5. Deep vein thrombosis prophylaxis. Subcutaneous heparin.  GI prophylaxis, on Protonix.   CODE STATUS: PATIENT IS DO NOT RESUSCITATE/DO NOT INTUBATE AS PER HIS WISHES.   TOTAL TIME SPENT ON ADMISSION AND PATIENT CARE: 55 minutes.    ____________________________ Starleen Armsawood S. Elgergawy, MD dse:vtd D: 07/07/2012 02:29:00 ET T: 07/07/2012 10:48:26 ET JOB#: [id number]  cc: Starleen Armsawood S. Elgergawy, MD, <Dictator> Marisue IvanKanhka Linthavong, MD DAWOOD Teena IraniS ELGERGAWY MD ELECTRONICALLY SIGNED 07/08/2012 0:20

## 2015-01-30 NOTE — H&P (Signed)
PATIENT NAME:  Randy Peck, Randy Peck MR#:  [id number] DATE OF BIRTH:  Nov 19, 1922  DATE OF ADMISSION:  08/20/2012  PRIMARY CARE PHYSICIAN: Marisue Ivan, MD  REQUESTING PHYSICIAN: Glennie Isle, MD  CHIEF COMPLAINT: Altered mental status and weakness.   HISTORY OF PRESENT ILLNESS: The patient is an 81 year old male with a known history of congestive heart failure, atrial fibrillation, on aspirin, and chronic obstructive pulmonary disease who is being admitted for presyncope/syncope and elevated troponin. The patient lives at home, followed by hospice. He was doing okay all day yesterday, but started getting really weak and sleepy. He went to the bathroom around 9:00 p.m. last night and had been sitting on the toilet for a long time and finally his wife was able to get him down and both of them slept on the floor there. Finally, in the morning, she called EMS as he would not wake up. EMS checked his blood sugar and it was 92. Per record, he was oriented to person but not to place and was brought into the emergency department. He was found to be really hypotensive, in the ED, with initial blood pressure 90/68 and he was barely arousable to verbal stimuli. He is being admitted for further evaluation and management as he was found to have worsening renal function and elevated troponin with a value of 0.11. His urinalysis also shows possible infection.   PAST MEDICAL HISTORY:  1. Atrial fibrillation, on aspirin. 2. Systolic heart failure, ejection fraction 40 to 45% on last echocardiogram with severe mitral and tricuspid regurgitation.  3. Hypertension.  4. Hyperlipidemia.  5. Anemia of chronic disease.  6. Peptic ulcer disease.  7. Chronic kidney disease, stage II.  PAST SURGICAL HISTORY:  1. Lysis of adhesions. 2. Inguinal hernia. 3. Cataract.  4. Varicose vein.   ALLERGIES: Shellfish.   SOCIAL HISTORY: No smoking. No alcohol. No drug use. He used to work as Electrical engineer.  FAMILY  HISTORY: Positive for cerebrovascular accident in the family, in his mother.   REVIEW OF SYSTEMS: Unobtainable as the patient is very lethargic and is barely arousable to loud verbal stimuli.   HOME MEDICATIONS: 1. Tylenol 650 mg p.o. every 6 hours needed.  2. Acetaminophen/hydrocodone 500/5 mg one tablet p.o. every six hours as needed.  3. Aspirin 81 mg p.o. daily.  4. Lasix 80 mg p.o. twice a day. 5. Lorazepam 0.5 mg p.o. twice a day. 6. Lortab 5/500 mg one tablet p.o. every six hours as needed.  7. Metoprolol 25 mg p.o. twice a day. 8. Mirtazapine 15 mg p.o. at bedtime.  9. Morphine 20 mg/mL 0.25 to 0.5 mL p.o. daily as needed.  10. Senna 1 to 2 tablets p.o. twice a day.  11. Spiriva once daily.  12. Symbicort 160/12.5 mcg two puffs inhaled once a day.   PHYSICAL EXAMINATION:   VITAL SIGNS: Temperature 98.5, heart rate 86 per minute, respirations 20 per minute, blood pressure 90/68 mmHg, and he was saturating 96% on 2 liters oxygen via nasal cannula.  GENERAL: The patient is an 81 year old male lying in bed in deep sleep.   EYES: Pupils equal, round, reactive to light and accommodation. No scleral icterus. Extraocular muscles intact.   HENT: Head atraumatic, normocephalic. Oropharynx and nasopharynx dry and clear.   NECK: Supple. No jugular venous distention. No thyroid enlargement or tenderness.   PULMONARY: Lungs are clear to auscultation bilaterally. No wheezing, rales, rhonchi, or crepitation.   ABDOMEN: Soft, nontender, and nondistended. Bowel sounds present. No organomegaly appreciated.  NEUROLOGIC: Cranial nerves III through XII intact. Muscle strength 4 out of 5 strength in all extremities. Sensation intact. Difficult to examine as the patient is barely arousable to loud verbal stimuli. He was able to identify his wife and niece, but did not know where he was and falls back to sleep very easily. Gait not checked.   EXTREMITIES:  No pedal edema, cyanosis or clubbing. He  has chronic venostasis changes and very thick skin and also has some ulcerated area in left lower extremities. Right lower extremity is covered with an Ace wrap. He has about 2 x 2 cm healing ulcers on the lower extremities.   PSYCHIATRIC: Unable to assess as the patient is barely arousable. He was able to wake up on loud verbal stimuli, oriented to his niece and wife, but he is not oriented to place or time.   LABORATORY, DIAGNOSTIC AND RADIOLOGIC DATA: Normal BMP except BUN of 60 and creatinine 2.31.   Normal liver function tests, except total bilirubin of 4.3.  CK 211. MB fraction 2.2. Troponin 0.11.   CBC showed white count of 10.8, hemoglobin 11.4, hematocrit 35.2, and platelets 124.   PT 19.6. INR 1.6.   Urinalysis showed 46 WBCs, 2+ bacteria, and 1+ leukocyte esterase.   CT scan of the head without contrast showed atrophy with chronic microvascular changes. No acute abnormality.   Chest x-ray showed low-grade compensated congestive heart failure and bibasilar subsegmental atelectasis.   EKG shows atrial fibrillation with rate of 83 beats per minute, incomplete right bundle branch block, and prolonged QT of 412 ms. No major acute ST-T changes.   IMPRESSION AND PLAN:  1. Presyncope/syncope likely due to dehydration and/or urinary tract infection, but cannot rule out underlying neuro or cardiologic event. We will obtain bilateral carotid Doppler's, monitor him on telemetry for any cardiac arrhythmias, we will obtain serial troponins, start him on IV fluids and hold his Lasix. We will get physical therapy and care management. 2. Urinary tract infection based on UA. We will get urine culture sensitivity and start him on IV Rocephin. 3. Elevated troponin likely due to supply/demand ischemia. We will continue aspirin at this time, start him on low-dose metoprolol and add statin if in case he does rule in for myocardial infarction, but I doubt it.  4. Acute on chronic kidney disease, stage  II, with a baseline creatinine of 1.5. We will hold Lasix, start him on IV fluids and monitor his kidney function. We will avoid any nephrotoxins.  5. Hypertension, likely due to dehydration. We will start him on IV hydration and monitor his blood pressure with cautious administration of blood pressure medications.  6. Atrial fibrillation, on aspirin. Rate is well controlled.   CODE STATUS: DO NOT RESUSCITATE.   He is followed by hospice at home. We will consult care management for discharge planning.  TOTAL TIME SPENT: 55 minutes.  ____________________________ Ellamae SiaVipul S. Sherryll BurgerShah, MD vss:slb D: 08/20/2012 15:06:17 ET T: 08/20/2012 15:43:56 ET JOB#: [id number]  cc: Isaura Schiller S. Sherryll BurgerShah, MD, <Dictator> Marisue IvanKanhka Linthavong, MD Ellamae SiaVIPUL S [facility] MD ELECTRONICALLY SIGNED 08/20/2012 17:21

## 2015-01-30 NOTE — Consult Note (Signed)
PATIENT NAME:  Randy Peck, Randy Peck MR#:  [id number] DATE OF BIRTH:  May 01, 1923  DATE OF CONSULTATION:  07/07/2012  REFERRING PHYSICIAN:   CONSULTING PHYSICIAN:  Marcina Millard, MD  PRIMARY CARE PHYSICIAN: Dr. Burnadette Pop   CHIEF COMPLAINT: Shortness of breath.   REASON FOR CONSULTATION: Consultation requested for evaluation of congestive heart failure.   HISTORY OF PRESENT ILLNESS: The patient is an 81 year old gentleman with known history of congestive heart failure due to diastolic dysfunction, atrial fibrillation, hypertension, and moderate pulmonary hypertension. The patient was in his usual state of poor health but has been experiencing increasing shortness of breath with exertion, pedal edema, and presented to [facility] Emergency Room where he was noted to be in atrial fibrillation with a rapid ventricular rate and fluid overloaded. The patient was admitted to telemetry where he has been treated with furosemide drip with consequent diuresis and modest clinical improvement. Admission labs were notable for borderline elevated troponin of 0.07 without peak or trough, elevated BUN and creatinine 47 and 2.07, respectively. EKG was nondiagnostic.   PAST MEDICAL HISTORY:  1. Atrial fibrillation with a CHADS-2 score of III, currently on aspirin. 2. Congestive heart failure with preserved left ventricular function presumed to be diastolic dysfunction.  3. Moderate pulmonary hypertension with RVSP of 61 on most recent echocardiogram.  4. Hypertension.  5. Hyperlipidemia.  6. Anemia.  7. Peptic ulcer disease.  8. Chronic kidney disease.   MEDICATIONS ON ADMISSION:  1. Aspirin 81 mg daily.  2. Lisinopril 5 mg daily.  3. Lasix 20 mg daily.  4. Ventolin inhaler 2 puffs q.4 to 6 hours p.r.n.  5. Symbicort 160/4.5 two puffs b.i.d. 6. Spiriva 18 mcg daily.  7. Senna 1 to 2 tabs b.i.d.  8. Roxanol p.r.n.  9. Oxycodone 5 mg q.4 p.r.n.  10. Mucinex 1200 mg b.i.d.  11. Morphine 15 mg  q.12. 12. Mirtazapine 15 mg daily.  13. Acetaminophen q.6 p.r.n.   SOCIAL HISTORY: The patient denies tobacco or EtOH abuse.   FAMILY HISTORY: No immediate family history of coronary artery disease or myocardial infarction.   REVIEW OF SYSTEMS: CONSTITUTIONAL: No fever or chills. EYES: No blurry vision. EARS: No hearing loss. RESPIRATORY: Shortness of breath as described above. CARDIOVASCULAR: The patient denies chest pain. He does have peripheral edema which is chronic. GI: No nausea, vomiting, or diarrhea. GU: No dysuria or hematuria. ENDOCRINE: No polyuria or polydipsia. HEMATOLOGICAL: No easy bruising or bleeding. INTEGUMENTARY: The patient has changes secondary to chronic pedal edema in both lower extremities. MUSCULOSKELETAL: No arthralgias or myalgias. NEUROLOGICAL: No focal muscle weakness or numbness. PSYCHOLOGICAL: No depression or anxiety.   PHYSICAL EXAMINATION:   VITAL SIGNS: Blood pressure 114/82, pulse 79, respirations 18, temperature 97.8, pulse oximetry 99%.   HEENT: Pupils equal and reactive to light and accommodation.   NECK: Supple without thyromegaly.   LUNGS: Decreased breath sounds in both bases.   CARDIOVASCULAR: Normal JVP. Normal PMI. Irregular irregular rhythm. Normal S1, S2. No appreciable gallop, murmur, or rub.   ABDOMEN: Soft, nontender.   EXTREMITIES: There was 2+ bilateral pedal edema with brawny changes on both lower extremities.   MUSCULOSKELETAL: Normal muscle tone.   NEUROLOGIC: The patient is alert and oriented x3. Motor and sensory both grossly intact.   IMPRESSION: This is an 81 year old gentleman with known normal left ventricular function with history of congestive heart failure due to diastolic dysfunction with underlying COPD and moderate pulmonary hypertension who presents with fluid retention which appears to initially be responding to furosemide drip.  The patient has borderline elevated troponin in the absence of chest pain without peak or  trough which is likely due to demand/supply ischemia and not due to acute coronary syndrome.   RECOMMENDATIONS:  1. Agree with overall current therapy.  2. Continue furosemide drip.  3. Carefully monitor BUN and creatinine since the patient has underlying chronic kidney disease.  4. Review 2-D echocardiogram.  5. No further cardiac diagnostics required at this time.   ____________________________ Marcina MillardAlexander Graceson Nichelson, MD ap:drc D: 07/07/2012 16:19:43 ET T: 07/07/2012 17:08:42 ET JOB#: [id number]  cc: Marcina MillardAlexander Marly Schuld, MD, <Dictator> Marcina MillardALEXANDER Ainslee Sou MD ELECTRONICALLY SIGNED 07/30/2012 15:06

## 2015-01-30 NOTE — Discharge Summary (Signed)
PATIENT NAME:  Randy Peck, Randy Peck MR#:  [id number] DATE OF BIRTH:  1923-04-26  DATE OF ADMISSION:  08/20/2012 DATE OF DISCHARGE:  08/24/2012  DISCHARGE DIAGNOSES:  1. Hypotension that is improved.  2. Urinary tract infection.  3. Systolic congestive heart failure with ejection fraction 40 to 45%.  4. Atrial fibrillation, on aspirin.  5. Chronic lower extremity edema with wounds.  6. Acute on chronic renal insufficiency.  7. Chronic obstructive pulmonary disease.   DISCHARGE MEDICATIONS:  1. Aspirin 81 mg p.o. daily.  2. Symbicort 2 puffs every morning.  3. Mirtazapine 15 mg p.o. at bedtime.  4. Spiriva 18 mcg inhaled daily.  5. Cipro 250 p.o. b.i.d. x6 more days.  6. Trazodone 50 mg p.o. at bedtime.   MEDICATIONS TO HOLD: Lasix and metoprolol.   CONSULTS: None.   PROCEDURES: None.   PERTINENT LABS ON DAY OF DISCHARGE: Sodium 139, potassium 4.3, BUN 71, creatinine 2.0. Urine culture: Klebsiella greater than 100,000.   BRIEF HOSPITAL COURSE:  1. Hypertension: The patient initially came in with a diagnosis of hypotension. He was on both a beta blocker and a diuretic. He has had trouble with hypertension as an outpatient and came in because he was more confused due to the hypotension.  2. Acute on chronic renal failure: He initially came in with a creatinine that is 2.31 secondary to both the hypoperfusion and also due to the acute urinary tract infection. Both the urinary tract infection and hypotension are being addressed. He is going to continue with Cipro for six more days, and we will hold the Lasix and metoprolol at this time to control the hypotension, which should all improve. We will follow up with Dr. Netty Starring within a week to reassess. We will need a MET-B within a week to reassess.   DISPOSITION: I told the family that I thought the patient should go to a skilled nursing facility for further rehab and monitoring with skilled nursing. The family is adamant that the patient go  home today. It was against my medical advice with his renal function the way it was, but the patient was persistent that he could go home on Hospice. I explained the risk and the benefits, and the family understands that and will be by his bedside 24 hours a day, per family. He will follow-up with me within a week.   ____________________________ Dion Body, MD [id number] D: 08/24/2012 13:04:19 ET T: 08/25/2012 10:05:21 ET JOB#: [id number]  cc: Dion Body, MD, <Dictator> Dion Body MD ELECTRONICALLY SIGNED 08/30/2012 8:00
